# Patient Record
Sex: Male | Born: 1960 | Race: White | Hispanic: No | Smoking: Former smoker
Health system: Southern US, Community
[De-identification: ages and names within clinical notes are randomized; demographics above are authoritative.]

## PROBLEM LIST (undated history)

## (undated) DIAGNOSIS — R519 Headache, unspecified: Secondary | ICD-10-CM

## (undated) DIAGNOSIS — Z6838 Body mass index (BMI) 38.0-38.9, adult: Secondary | ICD-10-CM

## (undated) DIAGNOSIS — R51 Headache: Secondary | ICD-10-CM

## (undated) DIAGNOSIS — I10 Essential (primary) hypertension: Secondary | ICD-10-CM

## (undated) DIAGNOSIS — R079 Chest pain, unspecified: Secondary | ICD-10-CM

## (undated) DIAGNOSIS — J029 Acute pharyngitis, unspecified: Secondary | ICD-10-CM

## (undated) DIAGNOSIS — K219 Gastro-esophageal reflux disease without esophagitis: Secondary | ICD-10-CM

## (undated) DIAGNOSIS — R1310 Dysphagia, unspecified: Secondary | ICD-10-CM

## (undated) DIAGNOSIS — R000 Tachycardia, unspecified: Secondary | ICD-10-CM

## (undated) DIAGNOSIS — H9202 Otalgia, left ear: Secondary | ICD-10-CM

## (undated) HISTORY — DX: Tachycardia, unspecified: R00.0

## (undated) HISTORY — DX: Chest pain, unspecified: R07.9

## (undated) HISTORY — DX: Headache: R51

## (undated) HISTORY — DX: Dysphagia, unspecified: R13.10

## (undated) HISTORY — DX: Acute pharyngitis, unspecified: J02.9

## (undated) HISTORY — DX: Body mass index (BMI) 38.0-38.9, adult: Z68.38

## (undated) HISTORY — DX: Headache, unspecified: R51.9

## (undated) HISTORY — DX: Gastro-esophageal reflux disease without esophagitis: K21.9

## (undated) HISTORY — PX: BACK SURGERY: SHX140

## (undated) HISTORY — DX: Otalgia, left ear: H92.02

---

## 2003-09-01 DIAGNOSIS — M51379 Other intervertebral disc degeneration, lumbosacral region without mention of lumbar back pain or lower extremity pain: Secondary | ICD-10-CM | POA: Insufficient documentation

## 2003-09-01 DIAGNOSIS — M5137 Other intervertebral disc degeneration, lumbosacral region: Secondary | ICD-10-CM | POA: Insufficient documentation

## 2003-09-01 DIAGNOSIS — K589 Irritable bowel syndrome without diarrhea: Secondary | ICD-10-CM | POA: Insufficient documentation

## 2004-10-24 DIAGNOSIS — E785 Hyperlipidemia, unspecified: Secondary | ICD-10-CM | POA: Insufficient documentation

## 2006-12-20 DIAGNOSIS — M5030 Other cervical disc degeneration, unspecified cervical region: Secondary | ICD-10-CM | POA: Insufficient documentation

## 2008-05-25 DIAGNOSIS — M6530 Trigger finger, unspecified finger: Secondary | ICD-10-CM | POA: Insufficient documentation

## 2018-11-11 ENCOUNTER — Telehealth: Payer: Self-pay

## 2018-11-11 NOTE — Telephone Encounter (Signed)
NOTES ON FILE 

## 2018-12-09 ENCOUNTER — Ambulatory Visit (INDEPENDENT_AMBULATORY_CARE_PROVIDER_SITE_OTHER): Payer: Managed Care, Other (non HMO) | Admitting: Cardiology

## 2018-12-09 ENCOUNTER — Encounter: Payer: Self-pay | Admitting: Cardiology

## 2018-12-09 ENCOUNTER — Encounter (INDEPENDENT_AMBULATORY_CARE_PROVIDER_SITE_OTHER): Payer: Self-pay

## 2018-12-09 VITALS — BP 134/86 | HR 102 | Ht 66.0 in | Wt 253.0 lb

## 2018-12-09 DIAGNOSIS — R000 Tachycardia, unspecified: Secondary | ICD-10-CM

## 2018-12-09 DIAGNOSIS — R079 Chest pain, unspecified: Secondary | ICD-10-CM

## 2018-12-09 NOTE — Progress Notes (Signed)
Cardiology Office Note    Date:  12/10/2018   ID:  Ryan Buck, DOB November 22, 1960, MRN [id number]  PCP:  Lawerance Sabal, PA  Cardiologist:  Armanda Magic, MD   Chief Complaint  Patient presents with  . Chest Pain    History of Present Illness:  Ryan Buck is a 58 y.o. male who is being seen today for the evaluation of chest pain and abnormal EKG at the request of Lawerance Sabal, [place].  Is a 58 year old male with a history of obesity, GERD and sinus tachycardia.  He was seen on 11/06/2018 by his PCP with complaints of a sore throat.  It was felt that he had pharyngitis and was started on antibiotics.  He was also complaining of chest pain at the time and is now referred for further evaluation.  He tells me the chest pain has been going on for years and recently mentioned it to his PCP.  He describes it as midsternal with radiation into his neck and left arm associated with diaphoresis.  He says slow breathing and rest will resolve the discomfort.  He will intermittently get a dull left arm achiness as well but not necessarily occurs with his chest pain.  His chest pain is exertional and nonexertional.  He was started on a PPI but really is unsure if it has helped.  He also gets shortness of breath and fatigue with exertion or bending over.  He tells me the chest pain usually occurs at least once per week but occasionally 2-3 times per week and then sometimes will go a whole month without having pain.  It usually last 5 to 10 minutes and resolves with aspirin.  He denies any PND, orthopnea, lower extremity edema, dizziness or syncope.  His EKG in the office showed normal sinus rhythm to sinus tachycardia at 112 bpm with incomplete right bundle branch block.  He used to smoke but quit 1992.  He has a family history of CAD with his father having a CABG and several PCI's.  Past Medical History:  Diagnosis Date  . Body mass index (bmi) 38.0-38.9, adult   . Chest pain   . Dysphagia   . GERD  (gastroesophageal reflux disease)   . Headache   . Otalgia, left ear   . Pharyngitis   . Sinus tachycardia   . Sore throat       Current Medications: Current Meds  Medication Sig  . buPROPion (WELLBUTRIN XL) 150 MG 24 hr tablet Take 150 mg by mouth as directed.  Marland Kitchen buPROPion (WELLBUTRIN XL) 300 MG 24 hr tablet Take 300 mg by mouth as directed.  . celecoxib (CELEBREX) 100 MG capsule Take 100 mg by mouth 2 (two) times daily.  Marland Kitchen HYDROXYZINE HCL PO Take 50 mg by mouth 3 (three) times daily.  Marland Kitchen losartan-hydrochlorothiazide (HYZAAR) 100-12.5 MG tablet Take 1 tablet by mouth daily.  Marland Kitchen METOPROLOL TARTRATE PO Take 50 mg by mouth 2 (two) times daily.  Marland Kitchen omeprazole (PRILOSEC) 40 MG capsule Take 40 mg by mouth daily.  . traZODone (DESYREL) 50 MG tablet Take 50 mg by mouth at bedtime.    Allergies:   Patient has no known allergies.   Social History   Socioeconomic History  . Marital status: Legally Separated    Spouse name: Not on file  . Number of children: Not on file  . Years of education: Not on file  . Highest education level: Not on file  Occupational History  . Not on file  Social Needs  . Financial resource strain: Not on file  . Food insecurity:    Worry: Not on file    Inability: Not on file  . Transportation needs:    Medical: Not on file    Non-medical: Not on file  Tobacco Use  . Smoking status: Former Smoker    Last attempt to quit: 12/08/1990    Years since quitting: 28.0  . Smokeless tobacco: Never Used  Substance and Sexual Activity  . Alcohol use: Yes    Alcohol/week: 2.0 - 3.0 standard drinks    Types: 2 - 3 Cans of beer per week  . Drug use: Not on file  . Sexual activity: Not on file  Lifestyle  . Physical activity:    Days per week: Not on file    Minutes per session: Not on file  . Stress: Not on file  Relationships  . Social connections:    Talks on phone: Not on file    Gets together: Not on file    Attends religious service: Not on file     Active member of club or organization: Not on file    Attends meetings of clubs or organizations: Not on file    Relationship status: Not on file  Other Topics Concern  . Not on file  Social History Narrative  . Not on file     Family History:  The patient's family history includes CAD in his father; Heart disease in his father.   ROS:   Please see the history of present illness.    ROS All other systems reviewed and are negative.  No flowsheet data found.     PHYSICAL EXAM:   VS:  BP 134/86   Pulse (!) 102   Ht  (1.676 m)   Wt 253 lb (114.8 kg)   BMI 40.84 kg/m    GEN: Well nourished, well developed, in no acute distress  HEENT: normal  Neck: no JVD, carotid bruits, or masses Cardiac: RRR; no murmurs, rubs, or gallops,no edema.  Intact distal pulses bilaterally.  Respiratory:  clear to auscultation bilaterally, normal work of breathing GI: soft, nontender, nondistended, + BS MS: no deformity or atrophy  Skin: warm and dry, no rash Neuro:  Alert and Oriented x 3, Strength and sensation are intact Psych: euthymic mood, full affect  Wt Readings from Last 3 Encounters:  12/09/18 253 lb (114.8 kg)      Studies/Labs Reviewed:   EKG:  EKG is ordered today and showed sinus tachycardia at 102bpm with IRBBB  Recent Labs: No results found for requested labs within last 8760 hours.   Lipid Panel No results found for: CHOL, TRIG, HDL, CHOLHDL, VLDL, LDLCALC, LDLDIRECT  Additional studies/ records that were reviewed today include:  Office notes from PCP    ASSESSMENT:    1. Chest pain, unspecified type   2. Tachycardia      PLAN:  In order of problems listed above:  1.  Chest pain -his chest pain has typical and atypical components to it.  It is exertional as well as nonexertional.  Is been going on for years and usually occurs 1-2 times a week but occasionally he will go a whole month without having any discomfort.  When he does get it though there is  radiation of the discomfort along with the associated diaphoresis and improves with rest.  He is also been short of breath with exertional fatigue as well.  EKG in PCP office showed normal  sinus rhythm to sinus tachycardia 112 bpm with no ST changes and incomplete right bundle branch block.  The sinus tachycardia was in the setting of acute pharyngitis and likely related to pain and URI.  EKG in the office today showed normal sinus rhythm to sinus tach at 102 bpm.  Cardiac risk factors include remote history of tobacco use hypertension and family history of CAD.  I have recommended a coronary CTA with morphology and FFR to assess for obstructive CAD.  2.  Sinus tachycardia - heart rate was elevated in the PCP office but that was in the setting of acute pharyngitis and URI.  Heart rate today is 102 bpm.  I am going to get a 24-hour Holter monitor to assess average heart rate.  Complain of his heart racing at times.   Medication Adjustments/Labs and Tests Ordered: Current medicines are reviewed at length with the patient today.  Concerns regarding medicines are outlined above.  Medication changes, Labs and Tests ordered today are listed in the Patient Instructions below.  Patient Instructions  Medication Instructions:  Your physician recommends that you continue on your current medications as directed. Please refer to the Current Medication list given to you today.  If you need a refill on your cardiac medications before your next appointment, please call your pharmacy.   Lab work: Future: BMET, before Cardiac CT  If you have labs (blood work) drawn today and your tests are completely normal, you will receive your results only by: Marland Kitchen MyChart Message (if you have MyChart) OR . A paper copy in the mail If you have any lab test that is abnormal or we need to change your treatment, we will call you to review the results.  Testing/Procedures: Your physician has requested that you have an  echocardiogram. Echocardiography is a painless test that uses sound waves to create images of your heart. It provides your doctor with information about the size and shape of your heart and how well your heart's chambers and valves are working. This procedure takes approximately one hour. There are no restrictions for this procedure.  Your physician has recommended that you wear a 24 hour holter monitor. Holter monitors are medical devices that record the heart's electrical activity. Doctors most often use these monitors to diagnose arrhythmias. Arrhythmias are problems with the speed or rhythm of the heartbeat. The monitor is a small, portable device. You can wear one while you do your normal daily activities. This is usually used to diagnose what is causing palpitations/syncope (passing out).  Your physician has requested that you have cardiac CT. Cardiac computed tomography (CT) is a painless test that uses an x-ray machine to take clear, detailed pictures of your heart. For further information please visit [email or web address] Please follow instruction sheet as given.   Follow-Up: As needed, following results.   Please arrive at the [facility] main entrance of [facility] at xx:xx AM (30-45 minutes prior to test start time)  [facility] [address] [place], [place] [address] [phone]  Proceed to the [facility] Radiology Department (First Floor).  Please follow these instructions carefully (unless otherwise directed):  Hold all erectile dysfunction medications at least 48 hours prior to test.  On the Night Before the Test: . Be sure to Drink plenty of water. . Do not consume any caffeinated/decaffeinated beverages or chocolate 12 hours prior to your test. . Do not take any antihistamines 12 hours prior to your test.  On the Day of the  Test: . Drink plenty of water. Do not drink any water within one hour of the test. . Do not eat any food 4 hours prior to  the test. . You may take your regular medications prior to the test.  . Take metoprolol (Lopressor) 100 mg, two hours prior to test.  After the Test: . Drink plenty of water. . After receiving IV contrast, you may experience a mild flushed feeling. This is normal. . On occasion, you may experience a mild rash up to 24 hours after the test. This is not dangerous. If this occurs, you can take Benadryl 25 mg and increase your fluid intake. . If you experience trouble breathing, this can be serious. If it is severe call 911 IMMEDIATELY. If it is mild, please call our office. . If you take any of these medications: Glipizide/Metformin, Avandament, Glucavance, please do not take 48 hours after completing test.      Signed, Armanda Magic, MD  12/10/2018 10:33 AM    [facility] [address] [place], [place], [place]  [address] Phone: [phone]; Fax: [phone]

## 2018-12-09 NOTE — Patient Instructions (Addendum)
Medication Instructions:  Your physician recommends that you continue on your current medications as directed. Please refer to the Current Medication list given to you today.  If you need a refill on your cardiac medications before your next appointment, please call your pharmacy.   Lab work: Future: BMET, before Cardiac CT  If you have labs (blood work) drawn today and your tests are completely normal, you will receive your results only by: Marland Kitchen MyChart Message (if you have MyChart) OR . A paper copy in the mail If you have any lab test that is abnormal or we need to change your treatment, we will call you to review the results.  Testing/Procedures: Your physician has requested that you have an echocardiogram. Echocardiography is a painless test that uses sound waves to create images of your heart. It provides your doctor with information about the size and shape of your heart and how well your heart's chambers and valves are working. This procedure takes approximately one hour. There are no restrictions for this procedure.  Your physician has recommended that you wear a 24 hour holter monitor. Holter monitors are medical devices that record the heart's electrical activity. Doctors most often use these monitors to diagnose arrhythmias. Arrhythmias are problems with the speed or rhythm of the heartbeat. The monitor is a small, portable device. You can wear one while you do your normal daily activities. This is usually used to diagnose what is causing palpitations/syncope (passing out).  Your physician has requested that you have cardiac CT. Cardiac computed tomography (CT) is a painless test that uses an x-ray machine to take clear, detailed pictures of your heart. For further information please visit [email or web address] Please follow instruction sheet as given.   Follow-Up: As needed, following results.   Please arrive at the [facility] main entrance of [facility] at xx:xx AM (30-45  minutes prior to test start time)  [facility] [address] [place], [place] [address] [phone]  Proceed to the [facility] Radiology Department (First Floor).  Please follow these instructions carefully (unless otherwise directed):  Hold all erectile dysfunction medications at least 48 hours prior to test.  On the Night Before the Test: . Be sure to Drink plenty of water. . Do not consume any caffeinated/decaffeinated beverages or chocolate 12 hours prior to your test. . Do not take any antihistamines 12 hours prior to your test.  On the Day of the Test: . Drink plenty of water. Do not drink any water within one hour of the test. . Do not eat any food 4 hours prior to the test. . You may take your regular medications prior to the test.  . Take metoprolol (Lopressor) 100 mg, two hours prior to test.  After the Test: . Drink plenty of water. . After receiving IV contrast, you may experience a mild flushed feeling. This is normal. . On occasion, you may experience a mild rash up to 24 hours after the test. This is not dangerous. If this occurs, you can take Benadryl 25 mg and increase your fluid intake. . If you experience trouble breathing, this can be serious. If it is severe call 911 IMMEDIATELY. If it is mild, please call our office. . If you take any of these medications: Glipizide/Metformin, Avandament, Glucavance, please do not take 48 hours after completing test.

## 2018-12-10 ENCOUNTER — Encounter: Payer: Self-pay | Admitting: Cardiology

## 2018-12-11 ENCOUNTER — Ambulatory Visit (INDEPENDENT_AMBULATORY_CARE_PROVIDER_SITE_OTHER): Payer: Managed Care, Other (non HMO) | Admitting: Otolaryngology

## 2018-12-15 NOTE — Addendum Note (Signed)
Addended by: Madalyn Rob A on: 12/15/2018 07:39 AM   Modules accepted: Orders

## 2018-12-30 ENCOUNTER — Ambulatory Visit (HOSPITAL_COMMUNITY): Payer: Managed Care, Other (non HMO)

## 2018-12-31 ENCOUNTER — Telehealth: Payer: Self-pay | Admitting: Cardiology

## 2018-12-31 NOTE — Telephone Encounter (Signed)
Patient had been called  regarding mailing monitor.  Ryan Buck called on 3/30 and spoke with Scheduler - he stated:" cancel his procedures and didn't want to speak to anyone".  I will cancel his orders for ct and 24 hr monitor.

## 2019-10-05 ENCOUNTER — Inpatient Hospital Stay (HOSPITAL_COMMUNITY)
Admission: EM | Admit: 2019-10-05 | Discharge: 2019-10-09 | DRG: 177 | Disposition: A | Discharge status: Home / Self Care | Payer: Managed Care, Other (non HMO) | Attending: Internal Medicine | Admitting: Internal Medicine

## 2019-10-05 ENCOUNTER — Emergency Department (HOSPITAL_COMMUNITY): Payer: Managed Care, Other (non HMO)

## 2019-10-05 ENCOUNTER — Other Ambulatory Visit: Payer: Self-pay

## 2019-10-05 ENCOUNTER — Encounter (HOSPITAL_COMMUNITY): Payer: Self-pay

## 2019-10-05 DIAGNOSIS — E860 Dehydration: Secondary | ICD-10-CM | POA: Diagnosis present

## 2019-10-05 DIAGNOSIS — Z87891 Personal history of nicotine dependence: Secondary | ICD-10-CM

## 2019-10-05 DIAGNOSIS — I4519 Other right bundle-branch block: Secondary | ICD-10-CM | POA: Diagnosis present

## 2019-10-05 DIAGNOSIS — K219 Gastro-esophageal reflux disease without esophagitis: Secondary | ICD-10-CM | POA: Diagnosis present

## 2019-10-05 DIAGNOSIS — I10 Essential (primary) hypertension: Secondary | ICD-10-CM | POA: Diagnosis present

## 2019-10-05 DIAGNOSIS — R0902 Hypoxemia: Secondary | ICD-10-CM | POA: Diagnosis present

## 2019-10-05 DIAGNOSIS — U071 COVID-19: Principal | ICD-10-CM

## 2019-10-05 DIAGNOSIS — R0602 Shortness of breath: Secondary | ICD-10-CM | POA: Diagnosis present

## 2019-10-05 DIAGNOSIS — Z8249 Family history of ischemic heart disease and other diseases of the circulatory system: Secondary | ICD-10-CM | POA: Diagnosis not present

## 2019-10-05 DIAGNOSIS — Z6832 Body mass index (BMI) 32.0-32.9, adult: Secondary | ICD-10-CM

## 2019-10-05 DIAGNOSIS — J1282 Pneumonia due to coronavirus disease 2019: Secondary | ICD-10-CM | POA: Diagnosis present

## 2019-10-05 DIAGNOSIS — Z79899 Other long term (current) drug therapy: Secondary | ICD-10-CM

## 2019-10-05 HISTORY — DX: Essential (primary) hypertension: I10

## 2019-10-05 LAB — COMPREHENSIVE METABOLIC PANEL
ALT: 60 U/L — ABNORMAL HIGH (ref 0–44)
AST: 66 U/L — ABNORMAL HIGH (ref 15–41)
Albumin: 3.5 g/dL (ref 3.5–5.0)
Alkaline Phosphatase: 139 U/L — ABNORMAL HIGH (ref 38–126)
Anion gap: 13 (ref 5–15)
BUN: 9 mg/dL (ref 6–20)
CO2: 27 mmol/L (ref 22–32)
Calcium: 9 mg/dL (ref 8.9–10.3)
Chloride: 99 mmol/L (ref 98–111)
Creatinine, Ser: 0.95 mg/dL (ref 0.61–1.24)
GFR calc Af Amer: >60 mL/min (ref >60)
GFR calc non Af Amer: >60 mL/min (ref >60)
Glucose, Bld: 105 mg/dL — ABNORMAL HIGH (ref 70–99)
Potassium: 4.5 mmol/L (ref 3.5–5.1)
Sodium: 139 mmol/L (ref 135–145)
Total Bilirubin: 0.7 mg/dL (ref 0.3–1.2)
Total Protein: 7.4 g/dL (ref 6.5–8.1)

## 2019-10-05 LAB — CBC WITH DIFFERENTIAL/PLATELET
Abs Immature Granulocytes: 0 10*3/uL (ref 0.00–0.07)
Basophils Absolute: 0.1 10*3/uL (ref 0.0–0.1)
Basophils Relative: 1 %
Eosinophils Absolute: 0 10*3/uL (ref 0.0–0.5)
Eosinophils Relative: 0 %
HCT: 49.9 % (ref 39.0–52.0)
Hemoglobin: 16.4 g/dL (ref 13.0–17.0)
Lymphocytes Relative: 18 %
Lymphs Abs: 1.3 10*3/uL (ref 0.7–4.0)
MCH: 31.2 pg (ref 26.0–34.0)
MCHC: 32.9 g/dL (ref 30.0–36.0)
MCV: 94.9 fL (ref 80.0–100.0)
Monocytes Absolute: 0.4 10*3/uL (ref 0.1–1.0)
Monocytes Relative: 5 %
Neutro Abs: 5.3 10*3/uL (ref 1.7–7.7)
Neutrophils Relative %: 76 %
Platelets: 218 10*3/uL (ref 150–400)
RBC: 5.26 MIL/uL (ref 4.22–5.81)
RDW: 12.2 % (ref 11.5–15.5)
WBC: 7 10*3/uL (ref 4.0–10.5)
nRBC: 0 % (ref 0.0–0.2)
nRBC: 0 /100 WBC

## 2019-10-05 LAB — PROCALCITONIN: Procalcitonin: <0.1 ng/mL

## 2019-10-05 LAB — D-DIMER, QUANTITATIVE: D-Dimer, Quant: 2.15 ug/mL-FEU — ABNORMAL HIGH (ref 0.00–0.50)

## 2019-10-05 LAB — TRIGLYCERIDES: Triglycerides: 156 mg/dL — ABNORMAL HIGH (ref <150)

## 2019-10-05 LAB — LACTATE DEHYDROGENASE: LDH: 192 U/L (ref 98–192)

## 2019-10-05 LAB — LACTIC ACID, PLASMA: Lactic Acid, Venous: 1.5 mmol/L (ref 0.5–1.9)

## 2019-10-05 LAB — FERRITIN: Ferritin: 561 ng/mL — ABNORMAL HIGH (ref 24–336)

## 2019-10-05 LAB — C-REACTIVE PROTEIN: CRP: 6 mg/dL — ABNORMAL HIGH (ref <1.0)

## 2019-10-05 LAB — FIBRINOGEN: Fibrinogen: 793 mg/dL — ABNORMAL HIGH (ref 210–475)

## 2019-10-05 MED ORDER — ACETAMINOPHEN 325 MG PO TABS: 650.0000 mg | ORAL_TABLET | Freq: Four times a day (QID) | ORAL | Status: DC | PRN

## 2019-10-05 MED ORDER — HYDROCOD POLST-CPM POLST ER 10-8 MG/5ML PO SUER: 5.0000 mL | Freq: Two times a day (BID) | ORAL | Status: DC | PRN

## 2019-10-05 MED ORDER — SODIUM CHLORIDE 0.9 % IV SOLN: 100.0000 mg | Freq: Every day | INTRAVENOUS | Status: DC

## 2019-10-05 MED ORDER — SODIUM CHLORIDE 0.9 % IV SOLN
100.0000 mg | Freq: Every day | INTRAVENOUS | Status: AC
  Administered 2019-10-06 – 2019-10-09 (×4): 100 mg via INTRAVENOUS
  Filled 2019-10-05 (×4): qty 20

## 2019-10-05 MED ORDER — ALBUTEROL SULFATE HFA 108 (90 BASE) MCG/ACT IN AERS
2.0000 | INHALATION_SPRAY | Freq: Four times a day (QID) | RESPIRATORY_TRACT | Status: DC
  Administered 2019-10-06 – 2019-10-09 (×12): 2 via RESPIRATORY_TRACT
  Filled 2019-10-05 (×2): qty 6.7

## 2019-10-05 MED ORDER — SODIUM CHLORIDE 0.9 % IV SOLN
200.0000 mg | Freq: Once | INTRAVENOUS | Status: AC
  Administered 2019-10-05: 200 mg via INTRAVENOUS
  Filled 2019-10-05: qty 200

## 2019-10-05 MED ORDER — SODIUM CHLORIDE 0.9 % IV SOLN: INTRAVENOUS | Status: DC

## 2019-10-05 MED ORDER — ONDANSETRON HCL 4 MG PO TABS: 4.0000 mg | ORAL_TABLET | Freq: Four times a day (QID) | ORAL | Status: DC | PRN

## 2019-10-05 MED ORDER — DEXAMETHASONE SODIUM PHOSPHATE 10 MG/ML IJ SOLN
6.0000 mg | INTRAMUSCULAR | Status: DC
  Administered 2019-10-05 – 2019-10-08 (×4): 6 mg via INTRAVENOUS
  Filled 2019-10-05 (×4): qty 1

## 2019-10-05 MED ORDER — SODIUM CHLORIDE 0.9 % IV SOLN: 200.0000 mg | Freq: Once | INTRAVENOUS | Status: DC

## 2019-10-05 MED ORDER — ENOXAPARIN SODIUM 40 MG/0.4ML ~~LOC~~ SOLN
40.0000 mg | SUBCUTANEOUS | Status: DC
  Administered 2019-10-05: 40 mg via SUBCUTANEOUS
  Filled 2019-10-05 (×3): qty 0.4

## 2019-10-05 MED ORDER — SODIUM CHLORIDE 0.9 % IV BOLUS
1000.0000 mL | Freq: Once | INTRAVENOUS | Status: AC
  Administered 2019-10-05: 19:00:00 1000 mL via INTRAVENOUS

## 2019-10-05 MED ORDER — SODIUM CHLORIDE 0.9 % IV SOLN
500.0000 mg | INTRAVENOUS | Status: DC
  Administered 2019-10-06: 500 mg via INTRAVENOUS
  Filled 2019-10-05: qty 500

## 2019-10-05 MED ORDER — GUAIFENESIN-DM 100-10 MG/5ML PO SYRP
10.0000 mL | ORAL_SOLUTION | ORAL | Status: DC | PRN
  Administered 2019-10-06 – 2019-10-07 (×2): 10 mL via ORAL
  Filled 2019-10-05 (×2): qty 10

## 2019-10-05 MED ORDER — SODIUM CHLORIDE 0.9 % IV SOLN
1.0000 g | INTRAVENOUS | Status: DC
  Administered 2019-10-05: 1 g via INTRAVENOUS
  Filled 2019-10-05: qty 10

## 2019-10-05 MED ORDER — ONDANSETRON HCL 4 MG/2ML IJ SOLN
4.0000 mg | Freq: Four times a day (QID) | INTRAMUSCULAR | Status: DC | PRN
  Administered 2019-10-07: 4 mg via INTRAVENOUS
  Filled 2019-10-05: qty 2

## 2019-10-05 MED ORDER — FAMOTIDINE 20 MG PO TABS
20.0000 mg | ORAL_TABLET | Freq: Two times a day (BID) | ORAL | Status: DC
  Administered 2019-10-06 – 2019-10-09 (×8): 20 mg via ORAL
  Filled 2019-10-05 (×7): qty 1

## 2019-10-05 NOTE — ED Triage Notes (Signed)
Pt presents to the ED after testing positive for COVID-19 on 12/23. Pt reports he has had worsening cough and generalized body aches at home. Pt reports he has been taking mucinex and tylenol at home to control his symptoms. Pt denying any pain at present time, complains of chest soreness with a cough and generalized body aches.

## 2019-10-05 NOTE — H&P (Signed)
History and Physical   Ryan Buck HYW:[id number] DOB: 12/28/1960 DOA: 10/05/2019  Referring MD/NP/PA: Dr. Billy Fischer  PCP: Denny Levy, [place]   Outpatient Specialists: None  Patient coming from: Home  Chief Complaint: Shortness of breath and cough with generalized weakness  HPI: Denman Pichardo is a 59 y.o. male with medical history significant of GERD, hypertension, morbid obesity, who presented to the ER after tested positive for COVID-19 on December 23.  He was trying to treat his symptoms at home symptomatically.  He has been taking Mucinex and Tylenol.  Also practicing quarantine.  Symptoms have continued to get worse.  Patient came in today with generalized body aches cough and shortness of breath.  He was seen and evaluated.  Patient has persistent sinus tachycardia at this point with his symptoms.  He is afebrile and does not appear to have significant bilateral infiltrates.  Patient is being admitted with COVID-19 pneumonia being symptomatic.Marland Kitchen  ED Course: Temperature 98.1 blood pressure 140/100 pulse 118 respiratory rate of 33 oxygen sat 94% on room air.  Chemistry appears to be within normal except for alkaline phosphatase 129, CBC entirely within normal.  LDH is 192 triglyceride 156 ferritin 561 CRP 6.0 lactic acid 1.5 procalcitonin less than 0.1.  Patient being admitted for treatment of Covid pneumonia showing low volume and left lung basal infiltrate..  Review of Systems: As per HPI otherwise 10 point review of systems negative.    Past Medical History:  Diagnosis Date   Body mass index (bmi) 38.0-38.9, adult    Chest pain    Dysphagia    GERD (gastroesophageal reflux disease)    Headache    Hypertension    Otalgia, left ear    Pharyngitis    Sinus tachycardia    Sore throat     Past Surgical History:  Procedure Laterality Date   BACK SURGERY       reports that he quit smoking about 28 years ago. He has never used smokeless tobacco. He reports current alcohol  use of about 2.0 - 3.0 standard drinks of alcohol per week. No history on file for drug.  No Known Allergies  Family History  Problem Relation Age of Onset   Heart disease Father    CAD Father      Prior to Admission medications   Medication Sig Start Date End Date Taking? Authorizing Provider  buPROPion (WELLBUTRIN XL) 150 MG 24 hr tablet Take 150 mg by mouth as directed. 10/09/18   [provider]  buPROPion (WELLBUTRIN XL) 300 MG 24 hr tablet Take 300 mg by mouth as directed. 10/09/18   [provider]  celecoxib (CELEBREX) 100 MG capsule Take 100 mg by mouth 2 (two) times daily. 05/31/17   [provider]  HYDROXYZINE HCL PO Take 50 mg by mouth 3 (three) times daily.    [provider]  losartan-hydrochlorothiazide (HYZAAR) 100-12.5 MG tablet Take 1 tablet by mouth daily.    [provider]  METOPROLOL TARTRATE PO Take 50 mg by mouth 2 (two) times daily.    [provider]  omeprazole (PRILOSEC) 40 MG capsule Take 40 mg by mouth daily.    [provider]  traZODone (DESYREL) 50 MG tablet Take 50 mg by mouth at bedtime.    [provider]    Physical Exam: Vitals:   10/05/19 1815 10/05/19 1845 10/05/19 1930 10/05/19 2209  BP: (!) 131/105 (!) 143/103 (!) 148/100   Pulse: (!) 110 (!) 109 (!) 107   Resp:  Temp:      TempSrc:      SpO2: 94% 94% 96% 95%  Weight:      Height:          Constitutional: Morbidly obese in no distress Vitals:   10/05/19 1815 10/05/19 1845 10/05/19 1930 10/05/19 2209  BP: (!) 131/105 (!) 143/103 (!) 148/100   Pulse: (!) 110 (!) 109 (!) 107   Resp:      Temp:      TempSrc:      SpO2: 94% 94% 96% 95%  Weight:      Height:       Eyes: PERRL, lids and conjunctivae normal ENMT: Mucous membranes are moist. Posterior pharynx clear of any exudate or lesions.Normal dentition.  Neck: normal, supple, no masses, no thyromegaly Respiratory: Decreased air entry with coarse breath  sounds and rhonchi, no wheeze normal respiratory effort. No accessory muscle use.  Cardiovascular: Tachycardia, no murmurs / rubs / gallops. No extremity edema. 2+ pedal pulses. No carotid bruits.  Abdomen: no tenderness, no masses palpated. No hepatosplenomegaly. Bowel sounds positive.  Musculoskeletal: no clubbing / cyanosis. No joint deformity upper and lower extremities. Good ROM, no contractures. Normal muscle tone.  Skin: no rashes, lesions, ulcers. No induration Neurologic: CN 2-12 grossly intact. Sensation intact, DTR normal. Strength 5/5 in all 4.  Psychiatric: Normal judgment and insight. Alert and oriented x 3. Normal mood.     Labs on Admission: I have personally reviewed following labs and imaging studies  CBC: Recent Labs  Lab 10/05/19 1844  WBC 7.0  NEUTROABS 5.3  HGB 16.4  HCT 49.9  MCV 94.9  PLT 218   Basic Metabolic Panel: Recent Labs  Lab 10/05/19 1844  NA 139  K 4.5  CL 99  CO2 27  GLUCOSE 105*  BUN 9  CREATININE 0.95  CALCIUM 9.0   GFR: Estimated Creatinine Clearance: 105.9 mL/min (by C-G formula based on SCr of 0.95 mg/dL). Liver Function Tests: Recent Labs  Lab 10/05/19 1844  AST 66*  ALT 60*  ALKPHOS 139*  BILITOT 0.7  PROT 7.4  ALBUMIN 3.5   No results for input(s): LIPASE, AMYLASE in the last 168 hours. No results for input(s): AMMONIA in the last 168 hours. Coagulation Profile: No results for input(s): INR, PROTIME in the last 168 hours. Cardiac Enzymes: No results for input(s): CKTOTAL, CKMB, CKMBINDEX, TROPONINI in the last 168 hours. BNP (last 3 results) No results for input(s): PROBNP in the last 8760 hours. HbA1C: No results for input(s): HGBA1C in the last 72 hours. CBG: No results for input(s): GLUCAP in the last 168 hours. Lipid Profile: Recent Labs    10/05/19 2126  TRIG 156*   Thyroid Function Tests: No results for input(s): TSH, T4TOTAL, FREET4, T3FREE, THYROIDAB in the last 72 hours. Anemia Panel: Recent Labs     10/05/19 2125  FERRITIN 561*   Urine analysis: No results found for: COLORURINE, APPEARANCEUR, LABSPEC, PHURINE, GLUCOSEU, HGBUR, BILIRUBINUR, KETONESUR, PROTEINUR, UROBILINOGEN, NITRITE, LEUKOCYTESUR Sepsis Labs: @LABRCNTIP (procalcitonin:4,lacticidven:4) )No results found for this or any previous visit (from the past 240 hour(s)).   Radiological Exams on Admission: DG Chest Portable 1 View  Result Date: 10/05/2019 CLINICAL DATA:  Cough generalized body aches recent positive COVID-19 test. EXAM: PORTABLE CHEST 1 VIEW COMPARISON:  None FINDINGS: Cardiomediastinal contours accentuated by low depth of expansion. Similar changes associated with prominence of hila on left and right due to low volume chest. Partial silhouetting of the left hemidiaphragm. Subtle increased interstitial markings/vascular crowding. No  signs of dense consolidation or pleural effusion. Visualized skeletal structures are unremarkable. IMPRESSION: Low volume chest but with area in the left lung base suspicious for pneumonia Electronically Signed   By: Donzetta Kohut M.D.   On: 10/05/2019 18:05    EKG: Independently reviewed.  Shows sinus tachycardia with no significant ST changes  Assessment/Plan Principal Problem:   Pneumonia due to COVID-19 virus Active Problems:   GERD (gastroesophageal reflux disease)   Benign essential HTN   Morbid obesity (HCC)     #1 COVID-19 pneumonia: Patient has new oxygen demand.  He also has significant sinus tachycardia probably due to dehydration.  We will admit the patient and initiate him on COVID-19 pneumonia protocol.  Placed him on oxygen with remdesivir, dexamethasone, azithromycin with Rocephin as well as albuterol inhalers.  Also monitor his markers.  He may be a candidate for Actemra if CRP continues to rise.  #2 hypertension: Confirm and resume home medications.  #3 morbid obesity: Dietary counseling.  #4 GERD: Continue with PPIs   DVT prophylaxis: Lovenox Code  Status: Full code Family Communication: No family at bedside Disposition Plan: Home Consults called: None Admission status: Inpatient  Severity of Illness: The appropriate patient status for this patient is INPATIENT. Inpatient status is judged to be reasonable and necessary in order to provide the required intensity of service to ensure the patient's safety. The patient's presenting symptoms, physical exam findings, and initial radiographic and laboratory data in the context of their chronic comorbidities is felt to place them at high risk for further clinical deterioration. Furthermore, it is not anticipated that the patient will be medically stable for discharge from the hospital within 2 midnights of admission. The following factors support the patient status of inpatient.   " The patient's presenting symptoms include shortness of breath and cough. " The worrisome physical exam findings include coarse breath sounds. " The initial radiographic and laboratory data are worrisome because of evidence of left lobar pneumonia. " The chronic co-morbidities include hypertension and GERD.   * I certify that at the point of admission it is my clinical judgment that the patient will require inpatient hospital care spanning beyond 2 midnights from the point of admission due to high intensity of service, high risk for further deterioration and high frequency of surveillance required.Lonia Blood MD [facility] Pager [phone]  If 7PM-7AM, please contact night-coverage [email or web address] Password [email or web address]  10/05/2019, 11:50 PM

## 2019-10-06 LAB — CREATININE, SERUM
Creatinine, Ser: 0.97 mg/dL (ref 0.61–1.24)
GFR calc Af Amer: >60 mL/min (ref >60)
GFR calc non Af Amer: >60 mL/min (ref >60)

## 2019-10-06 LAB — COMPREHENSIVE METABOLIC PANEL
ALT: 52 U/L — ABNORMAL HIGH (ref 0–44)
AST: 53 U/L — ABNORMAL HIGH (ref 15–41)
Albumin: 3 g/dL — ABNORMAL LOW (ref 3.5–5.0)
Alkaline Phosphatase: 123 U/L (ref 38–126)
Anion gap: 12 (ref 5–15)
BUN: 8 mg/dL (ref 6–20)
CO2: 23 mmol/L (ref 22–32)
Calcium: 8.5 mg/dL — ABNORMAL LOW (ref 8.9–10.3)
Chloride: 104 mmol/L (ref 98–111)
Creatinine, Ser: 0.99 mg/dL (ref 0.61–1.24)
GFR calc Af Amer: >60 mL/min (ref >60)
GFR calc non Af Amer: >60 mL/min (ref >60)
Glucose, Bld: 154 mg/dL — ABNORMAL HIGH (ref 70–99)
Potassium: 4.4 mmol/L (ref 3.5–5.1)
Sodium: 139 mmol/L (ref 135–145)
Total Bilirubin: 0.5 mg/dL (ref 0.3–1.2)
Total Protein: 6.7 g/dL (ref 6.5–8.1)

## 2019-10-06 LAB — CBC WITH DIFFERENTIAL/PLATELET
Abs Immature Granulocytes: 0.07 10*3/uL (ref 0.00–0.07)
Basophils Absolute: 0 10*3/uL (ref 0.0–0.1)
Basophils Relative: 1 %
Eosinophils Absolute: 0 10*3/uL (ref 0.0–0.5)
Eosinophils Relative: 0 %
HCT: 45.9 % (ref 39.0–52.0)
Hemoglobin: 15.1 g/dL (ref 13.0–17.0)
Immature Granulocytes: 1 %
Lymphocytes Relative: 19 %
Lymphs Abs: 1.3 10*3/uL (ref 0.7–4.0)
MCH: 31.3 pg (ref 26.0–34.0)
MCHC: 32.9 g/dL (ref 30.0–36.0)
MCV: 95 fL (ref 80.0–100.0)
Monocytes Absolute: 0.2 10*3/uL (ref 0.1–1.0)
Monocytes Relative: 3 %
Neutro Abs: 4.9 10*3/uL (ref 1.7–7.7)
Neutrophils Relative %: 76 %
Platelets: 224 10*3/uL (ref 150–400)
RBC: 4.83 MIL/uL (ref 4.22–5.81)
RDW: 12.2 % (ref 11.5–15.5)
WBC: 6.5 10*3/uL (ref 4.0–10.5)
nRBC: 0 % (ref 0.0–0.2)

## 2019-10-06 LAB — CBC
HCT: 46.8 % (ref 39.0–52.0)
Hemoglobin: 15.2 g/dL (ref 13.0–17.0)
MCH: 31.3 pg (ref 26.0–34.0)
MCHC: 32.5 g/dL (ref 30.0–36.0)
MCV: 96.3 fL (ref 80.0–100.0)
Platelets: 210 10*3/uL (ref 150–400)
RBC: 4.86 MIL/uL (ref 4.22–5.81)
RDW: 12.3 % (ref 11.5–15.5)
WBC: 8.5 10*3/uL (ref 4.0–10.5)
nRBC: 0 % (ref 0.0–0.2)

## 2019-10-06 LAB — LACTIC ACID, PLASMA: Lactic Acid, Venous: 1.3 mmol/L (ref 0.5–1.9)

## 2019-10-06 LAB — MRSA PCR SCREENING: MRSA by PCR: NEGATIVE

## 2019-10-06 LAB — C-REACTIVE PROTEIN
CRP: 5.7 mg/dL — ABNORMAL HIGH (ref <1.0)
CRP: 5.2 mg/dL — ABNORMAL HIGH (ref <1.0)

## 2019-10-06 LAB — FERRITIN: Ferritin: 614 ng/mL — ABNORMAL HIGH (ref 24–336)

## 2019-10-06 LAB — D-DIMER, QUANTITATIVE: D-Dimer, Quant: 2.31 ug/mL-FEU — ABNORMAL HIGH (ref 0.00–0.50)

## 2019-10-06 LAB — ABO/RH: ABO/RH(D): A POS

## 2019-10-06 LAB — HIV ANTIBODY (ROUTINE TESTING W REFLEX): HIV Screen 4th Generation wRfx: NONREACTIVE

## 2019-10-06 MED ORDER — SODIUM CHLORIDE 0.9 % IV SOLN: INTRAVENOUS | Status: AC

## 2019-10-06 MED ORDER — BUSPIRONE HCL 15 MG PO TABS
7.5000 mg | ORAL_TABLET | Freq: Two times a day (BID) | ORAL | Status: DC
  Administered 2019-10-06 – 2019-10-09 (×6): 7.5 mg via ORAL
  Filled 2019-10-06 (×7): qty 1

## 2019-10-06 MED ORDER — METOPROLOL TARTRATE 50 MG PO TABS
50.0000 mg | ORAL_TABLET | Freq: Two times a day (BID) | ORAL | Status: DC
  Administered 2019-10-06 – 2019-10-09 (×6): 50 mg via ORAL
  Filled 2019-10-06 (×6): qty 1

## 2019-10-06 NOTE — ED Notes (Signed)
ED TO INPATIENT HANDOFF REPORT  ED Nurse Name and Phone #: Percival Spanish [phone]  S Name/Age/Gender Ryan Buck 59 y.o. male Room/Bed: 007C/007C  Code Status   Code Status: Full Code  Home/SNF/Other Home Patient oriented to: self, place, time and situation Is this baseline? Yes   Triage Complete: Triage complete  Chief Complaint Pneumonia due to COVID-19 virus [U07.1, J12.82]  Triage Note Pt presents to the ED after testing positive for COVID-19 on 12/23. Pt reports he has had worsening cough and generalized body aches at home. Pt reports he has been taking mucinex and tylenol at home to control his symptoms. Pt denying any pain at present time, complains of chest soreness with a cough and generalized body aches.     Allergies No Known Allergies  Level of Care/Admitting Diagnosis ED Disposition    ED Disposition Condition Comment   Admit  Hospital Area: [facility] [[id number]]  Level of Care: Telemetry Medical [[id number]]  Covid Evaluation: Confirmed COVID Positive  Diagnosis: Pneumonia due to COVID-19 virus [[id number]]  Admitting Physician: Rometta Emery [[id number]]  Attending Physician: Rometta Emery [[id number]]  Estimated length of stay: past midnight tomorrow  Certification:: I certify this patient will need inpatient services for at least 2 midnights       B Medical/Surgery History Past Medical History:  Diagnosis Date  . Body mass index (bmi) 38.0-38.9, adult   . Chest pain   . Dysphagia   . GERD (gastroesophageal reflux disease)   . Headache   . Hypertension   . Otalgia, left ear   . Pharyngitis   . Sinus tachycardia   . Sore throat    Past Surgical History:  Procedure Laterality Date  . BACK SURGERY       A IV Location/Drains/Wounds Patient Lines/Drains/Airways Status   Active Line/Drains/Airways    Name:   Placement date:   Placement time:   Site:   Days:   Peripheral IV 10/05/19 Right Antecubital   10/05/19    1842    Antecubital   1    Peripheral IV 10/05/19 Left Antecubital   10/05/19    2321    Antecubital   1          Intake/Output Last 24 hours  Intake/Output Summary (Last 24 hours) at 10/06/2019 1523 Last data filed at 10/06/2019 1142 Gross per 24 hour  Intake 1850 ml  Output --  Net 1850 ml    Labs/Imaging Results for orders placed or performed during the hospital encounter of 10/05/19 (from the past 48 hour(s))  CBC with Differential     Status: None   Collection Time: 10/05/19  6:44 PM  Result Value Ref Range   WBC 7.0 4.0 - 10.5 K/uL   RBC 5.26 4.22 - 5.81 MIL/uL   Hemoglobin 16.4 13.0 - 17.0 g/dL   HCT 64.3 32.9 - 51.8 %   MCV 94.9 80.0 - 100.0 fL   MCH 31.2 26.0 - 34.0 pg   MCHC 32.9 30.0 - 36.0 g/dL   RDW 84.1 66.0 - 63.0 %   Platelets 218 150 - 400 K/uL   nRBC 0.0 0.0 - 0.2 %   Neutrophils Relative % 76 %   Neutro Abs 5.3 1.7 - 7.7 K/uL   Lymphocytes Relative 18 %   Lymphs Abs 1.3 0.7 - 4.0 K/uL   Monocytes Relative 5 %   Monocytes Absolute 0.4 0.1 - 1.0 K/uL   Eosinophils Relative 0 %   Eosinophils Absolute 0.0 0.0 -  0.5 K/uL   Basophils Relative 1 %   Basophils Absolute 0.1 0.0 - 0.1 K/uL   nRBC 0 0 /100 WBC   Abs Immature Granulocytes 0.00 0.00 - 0.07 K/uL    Comment: Performed at [facility], [address] [place], [place] [address]  Comprehensive metabolic panel     Status: Abnormal   Collection Time: 10/05/19  6:44 PM  Result Value Ref Range   Sodium 139 135 - 145 mmol/L   Potassium 4.5 3.5 - 5.1 mmol/L   Chloride 99 98 - 111 mmol/L   CO2 27 22 - 32 mmol/L   Glucose, Bld 105 (H) 70 - 99 mg/dL   BUN 9 6 - 20 mg/dL   Creatinine, Ser 9.140.95 0.61 - 1.24 mg/dL   Calcium 9.0 8.9 - 78.210.3 mg/dL   Total Protein 7.4 6.5 - 8.1 g/dL   Albumin 3.5 3.5 - 5.0 g/dL   AST 66 (H) 15 - 41 U/L   ALT 60 (H) 0 - 44 U/L   Alkaline Phosphatase 139 (H) 38 - 126 U/L   Total Bilirubin 0.7 0.3 - 1.2 mg/dL   GFR calc non Af Amer >60 >60 mL/min   GFR calc Af Amer >60 >60 mL/min   Anion gap 13 5  - 15    Comment: Performed at [facility], [address] [place], [place] [address]  Lactic acid, plasma     Status: None   Collection Time: 10/05/19  9:25 PM  Result Value Ref Range   Lactic Acid, Venous 1.5 0.5 - 1.9 mmol/L    Comment: Performed at [facility], [address] [place], [place] [address]  Blood Culture (routine x 2)     Status: None (Preliminary result)   Collection Time: 10/05/19  9:25 PM   Specimen: BLOOD  Result Value Ref Range   Specimen Description BLOOD LEFT ANTECUBITAL    Special Requests      BOTTLES DRAWN AEROBIC AND ANAEROBIC Blood Culture results may not be optimal due to an inadequate volume of blood received in culture bottles   Culture      NO GROWTH < 24 HOURS Performed at [facility], [address] [place], [place] [address]    Report Status PENDING   D-dimer, quantitative     Status: Abnormal   Collection Time: 10/05/19  9:25 PM  Result Value Ref Range   D-Dimer, Quant 2.15 (H) 0.00 - 0.50 ug/mL-FEU    Comment: (NOTE) At the manufacturer cut-off of 0.50 ug/mL FEU, this assay has been documented to exclude PE with a sensitivity and negative predictive value of 97 to 99%.  At this time, this assay has not been approved by the FDA to exclude DVT/VTE. Results should be correlated with clinical presentation. Performed at [facility], [address] [place], [place] [address]   Lactate dehydrogenase     Status: None   Collection Time: 10/05/19  9:25 PM  Result Value Ref Range   LDH 192 98 - 192 U/L    Comment: Performed at [facility], [address] [place], [place] [address]  Ferritin     Status: Abnormal   Collection Time: 10/05/19  9:25 PM  Result Value Ref Range   Ferritin 561 (H) 24 - 336 ng/mL    Comment: Performed at [facility], [address] [place], [place] [address]  Fibrinogen     Status: Abnormal   Collection  Time: 10/05/19  9:25 PM  Result Value Ref Range   Fibrinogen 793 (H) 210  - 475 mg/dL    Comment: Performed at [place] [address], [place], [place] [address]  C-reactive protein     Status: Abnormal   Collection Time: 10/05/19  9:25 PM  Result Value Ref Range   CRP 6.0 (H) <1.0 mg/dL    Comment: Performed at [facility], [place] [address] [place], [place] [address]  Triglycerides     Status: Abnormal   Collection Time: 10/05/19  9:26 PM  Result Value Ref Range   Triglycerides 156 (H) <150 mg/dL    Comment: Performed at [place] [address], [place],  [address]  Procalcitonin     Status: None   Collection Time: 10/05/19  9:26 PM  Result Value Ref Range   Procalcitonin <0.10 ng/mL    Comment:        Interpretation: PCT (Procalcitonin) <= 0.5 ng/mL: Systemic infection (sepsis) is not likely. Local bacterial infection is possible. (NOTE)       Sepsis PCT Algorithm           Lower Respiratory Tract                                      Infection PCT Algorithm    ----------------------------     ----------------------------         PCT < 0.25 ng/mL                PCT < 0.10 ng/mL         Strongly encourage             Strongly discourage   discontinuation of antibiotics    initiation of antibiotics    ----------------------------     -----------------------------       PCT 0.25 - 0.50 ng/mL            PCT 0.10 - 0.25 ng/mL               OR       >80% decrease in PCT            Discourage initiation of                                            antibiotics      Encourage discontinuation           of antibiotics    ----------------------------     -----------------------------         PCT >= 0.50 ng/mL              PCT 0.26 - 0.50 ng/mL               AND        <80% decrease in PCT             Encourage initiation of                                             antibiotics       Encourage continuation  of antibiotics    ----------------------------     -----------------------------        PCT >= 0.50 ng/mL                   PCT > 0.50 ng/mL               AND         increase in PCT                  Strongly encourage                                      initiation of antibiotics    Strongly encourage escalation           of antibiotics                                     -----------------------------                                           PCT <= 0.25 ng/mL                                                 OR                                        > 80% decrease in PCT                                     Discontinue / Do not initiate                                             antibiotics Performed at [facility], [address] [place], [place] [address]   Blood Culture (routine x 2)     Status: None (Preliminary result)   Collection Time: 10/05/19 10:38 PM   Specimen: BLOOD  Result Value Ref Range   Specimen Description BLOOD SITE NOT SPECIFIED    Special Requests      BOTTLES DRAWN AEROBIC AND ANAEROBIC Blood Culture results may not be optimal due to an inadequate volume of blood received in culture bottles   Culture      NO GROWTH < 24 HOURS Performed at [facility], [address] [place], [place] [address]    Report Status PENDING   ABO/Rh     Status: None   Collection Time: 10/05/19 11:06 PM  Result Value Ref Range   ABO/RH(D)      A POS Performed at [facility], [address] [place], [place] [address]   Lactic acid, plasma     Status: None   Collection Time: 10/06/19  1:24 AM  Result Value Ref Range   Lactic Acid, Venous 1.3  0.5 - 1.9 mmol/L    Comment: Performed at [facility], [address] [place], [place] [address]  HIV Antibody (routine testing w rflx)     Status: None   Collection Time: 10/06/19  1:24 AM  Result Value Ref Range   HIV Screen 4th Generation wRfx NON REACTIVE NON REACTIVE    Comment: Performed at [facility], [address], [place], [place] [address]  CBC     Status: None   Collection Time: 10/06/19  1:24  AM  Result Value Ref Range   WBC 8.5 4.0 - 10.5 K/uL   RBC 4.86 4.22 - 5.81 MIL/uL   Hemoglobin 15.2 13.0 - 17.0 g/dL   HCT 09.8 11.9 - 14.7 %   MCV 96.3 80.0 - 100.0 fL   MCH 31.3 26.0 - 34.0 pg   MCHC 32.5 30.0 - 36.0 g/dL   RDW 82.9 56.2 - 13.0 %   Platelets 210 150 - 400 K/uL   nRBC 0.0 0.0 - 0.2 %    Comment: Performed at [facility], [address], [place], [place] [address]  Creatinine, serum     Status: None   Collection Time: 10/06/19  1:24 AM  Result Value Ref Range   Creatinine, Ser 0.97 0.61 - 1.24 mg/dL   GFR calc non Af Amer >60 >60 mL/min   GFR calc Af Amer >60 >60 mL/min    Comment: Performed at [facility], [address], [place], [place] [address]  C-reactive protein     Status: Abnormal   Collection Time: 10/06/19  1:24 AM  Result Value Ref Range   CRP 5.7 (H) <1.0 mg/dL    Comment: Performed at [facility], [address] [place], [place] [address]  CBC with Differential/Platelet     Status: None   Collection Time: 10/06/19  5:25 AM  Result Value Ref Range   WBC 6.5 4.0 - 10.5 K/uL   RBC 4.83 4.22 - 5.81 MIL/uL   Hemoglobin 15.1 13.0 - 17.0 g/dL   HCT 13.2 44.0 - 10.2 %   MCV 95.0 80.0 - 100.0 fL   MCH 31.3 26.0 - 34.0 pg   MCHC 32.9 30.0 - 36.0 g/dL   RDW 72.5 36.6 - 44.0 %   Platelets 224 150 - 400 K/uL   nRBC 0.0 0.0 - 0.2 %   Neutrophils Relative % 76 %   Neutro Abs 4.9 1.7 - 7.7 K/uL   Lymphocytes Relative 19 %   Lymphs Abs 1.3 0.7 - 4.0 K/uL   Monocytes Relative 3 %   Monocytes Absolute 0.2 0.1 - 1.0 K/uL   Eosinophils Relative 0 %   Eosinophils Absolute 0.0 0.0 - 0.5 K/uL   Basophils Relative 1 %   Basophils Absolute 0.0 0.0 - 0.1 K/uL   Immature Granulocytes 1 %   Abs Immature Granulocytes 0.07 0.00 - 0.07 K/uL   Reactive, Benign Lymphocytes PRESENT     Comment: Performed at [facility], [address] [place], [place] [address]  Comprehensive metabolic panel     Status: Abnormal   Collection Time:  10/06/19  5:25 AM  Result Value Ref Range   Sodium 139 135 - 145 mmol/L   Potassium 4.4 3.5 - 5.1 mmol/L   Chloride 104 98 - 111 mmol/L   CO2 23 22 - 32 mmol/L   Glucose, Bld 154 (H) 70 - 99 mg/dL   BUN 8 6 - 20 mg/dL  Creatinine, Ser 0.99 0.61 - 1.24 mg/dL   Calcium 8.5 (L) 8.9 - 10.3 mg/dL   Total Protein 6.7 6.5 - 8.1 g/dL   Albumin 3.0 (L) 3.5 - 5.0 g/dL   AST 53 (H) 15 - 41 U/L   ALT 52 (H) 0 - 44 U/L   Alkaline Phosphatase 123 38 - 126 U/L   Total Bilirubin 0.5 0.3 - 1.2 mg/dL   GFR calc non Af Amer >60 >60 mL/min   GFR calc Af Amer >60 >60 mL/min   Anion gap 12 5 - 15    Comment: Performed at [facility], [address], [place], [place] [address]  C-reactive protein     Status: Abnormal   Collection Time: 10/06/19  5:25 AM  Result Value Ref Range   CRP 5.2 (H) <1.0 mg/dL    Comment: Performed at [facility], [address], [place], [place] [address]  D-dimer, quantitative (not at [facility])     Status: Abnormal   Collection Time: 10/06/19  5:25 AM  Result Value Ref Range   D-Dimer, Quant 2.31 (H) 0.00 - 0.50 ug/mL-FEU    Comment: (NOTE) At the manufacturer cut-off of 0.50 ug/mL FEU, this assay has been documented to exclude PE with a sensitivity and negative predictive value of 97 to 99%.  At this time, this assay has not been approved by the FDA to exclude DVT/VTE. Results should be correlated with clinical presentation. Performed at [facility], [address] [place], [place] [address]   Ferritin     Status: Abnormal   Collection Time: 10/06/19  5:25 AM  Result Value Ref Range   Ferritin 614 (H) 24 - 336 ng/mL    Comment: Performed at [facility], [address] [place], [place] [address]   DG Chest Portable 1 View  Result Date: 10/05/2019 CLINICAL DATA:  Cough generalized body aches recent positive COVID-19 test. EXAM: PORTABLE CHEST 1 VIEW COMPARISON:  None FINDINGS: Cardiomediastinal contours accentuated by low depth of expansion.  Similar changes associated with prominence of hila on left and right due to low volume chest. Partial silhouetting of the left hemidiaphragm. Subtle increased interstitial markings/vascular crowding. No signs of dense consolidation or pleural effusion. Visualized skeletal structures are unremarkable. IMPRESSION: Low volume chest but with area in the left lung base suspicious for pneumonia Electronically Signed   By: Donzetta Kohut M.D.   On: 10/05/2019 18:05    Pending Labs Unresulted Labs (From admission, onward)    Start     Ordered   10/12/19 0500  Creatinine, serum  (enoxaparin (LOVENOX)    CrCl >/= 30 ml/min)  Weekly,   R    Comments: while on enoxaparin therapy    10/05/19 2254   10/06/19 0500  CBC with Differential/Platelet  Daily,   R     10/05/19 2254   10/06/19 0500  Comprehensive metabolic panel  Daily,   R     10/05/19 2254   10/06/19 0500  C-reactive protein  Daily,   R     10/05/19 2254   10/06/19 0500  D-dimer, quantitative (not at [facility])  Daily,   R     10/05/19 2254   10/06/19 0500  Ferritin  Daily,   R     10/05/19 2254          Vitals/Pain Today's Vitals   10/06/19 1215 10/06/19 1230 10/06/19 1300 10/06/19 1400  BP:   (!) 126/97 (!) 129/99  Pulse:  94 88 90 (!) 111  Resp: (!) 21 (!) 21 (!) 22 (!) 26  Temp:      TempSrc:      SpO2: 92% 95% 97% 95%  Weight:      Height:      PainSc:        Isolation Precautions Airborne and Contact precautions  Medications Medications  enoxaparin (LOVENOX) injection 40 mg (40 mg Subcutaneous Given 10/05/19 2333)  albuterol (VENTOLIN HFA) 108 (90 Base) MCG/ACT inhaler 2 puff (2 puffs Inhalation Given 10/06/19 1400)  dexamethasone (DECADRON) injection 6 mg (6 mg Intravenous Given 10/05/19 2333)  guaiFENesin-dextromethorphan (ROBITUSSIN DM) 100-10 MG/5ML syrup 10 mL (has no administration in time range)  chlorpheniramine-HYDROcodone (TUSSIONEX) 10-8 MG/5ML suspension 5 mL (has no administration in time range)  acetaminophen  (TYLENOL) tablet 650 mg (has no administration in time range)  ondansetron (ZOFRAN) tablet 4 mg (has no administration in time range)    Or  ondansetron (ZOFRAN) injection 4 mg (has no administration in time range)  remdesivir 200 mg in sodium chloride 0.9% 250 mL IVPB (0 mg Intravenous Stopped 10/06/19 0011)    Followed by  remdesivir 100 mg in sodium chloride 0.9 % 100 mL IVPB (0 mg Intravenous Stopped 10/06/19 1142)  famotidine (PEPCID) tablet 20 mg (20 mg Oral Given 10/06/19 1027)  0.9 %  sodium chloride infusion ( Intravenous Rate/Dose Verify 10/06/19 0852)  sodium chloride 0.9 % bolus 1,000 mL (0 mLs Intravenous Stopped 10/05/19 1931)    Mobility walks Low fall risk   Focused Assessments    R Recommendations: See Admitting Provider Note  Report given to:   Additional Notes:

## 2019-10-06 NOTE — Progress Notes (Signed)
PROGRESS NOTE    Ryan Buck  OMV:[id number] DOB: 1961/08/31 DOA: 10/05/2019 PCP: Lawerance Sabal, PA   Brief Narrative: 59 year old with past medical history significant for GERD, hypertension, morbid obesity who presented to the ER complaining of worsening shortness of breath, cough and generalized weakness.  Patient was diagnosed with COVID-19 on December 23.  Evaluation in the ED patient was tachycardic, chest x-ray: Showed low volume, left lower lung base suspicious for pneumonia.  Patient has been admitted for treatment of COVID-19 pneumonia.  Oxygen saturation 89 on room air on admission.  Assessment & Plan:   Principal Problem:   Pneumonia due to COVID-19 virus Active Problems:   GERD (gastroesophageal reflux disease)   Benign essential HTN   Morbid obesity (HCC)  1-Covid 19 PNA;  Patient presented with hypoxemia, oxygen saturation 89% on room air.  He was tachycardic on admission. -Continue with remdesivir and dexamethasone. -We will discontinue ceftriaxone and azithromycin, procalcitonin level low.  COVID-19 Labs  Recent Labs    10/05/19 2125 10/06/19 0124 10/06/19 0525  DDIMER 2.15*  --  2.31*  FERRITIN 561*  --  614*  LDH 192  --   --   CRP 6.0* 5.7* 5.2*    No results found for: SARSCOV2NAA 2-Dehydration, tachycardia: Patient with poor oral intake.  Continue with IV fluids for another 24 hours.  3-Morbid obesity: Need diet education.  4-GERD: Continue with PPI.  Estimated body mass index is 32.01 kg/m as calculated from the following:   Height as of this encounter: 6' (1.829 m).   Weight as of this encounter: 107 kg.   DVT prophylaxis: Lovenox Code Status: Full code Family Communication: care discussed with patient.  Disposition Plan: remain in the hospital for treatment of covid 19 PNA , hypoxemia.  Consultants:  none Procedures:   none  Antimicrobials:  Received one dose of ceftriaxone and azithromycin.   Subjective: He is feeling weak,  tired.  He report SOB  Objective: Vitals:   10/06/19 0400 10/06/19 0430 10/06/19 0500 10/06/19 0522  BP: 106/71 104/75 98/72   Pulse: 93   91  Resp:      Temp:      TempSrc:      SpO2: 91%   95%  Weight:      Height:        Intake/Output Summary (Last 24 hours) at 10/06/2019 0805 Last data filed at 10/06/2019 0122 Gross per 24 hour  Intake 1750 ml  Output --  Net 1750 ml   Filed Weights   10/05/19 1729  Weight: 107 kg    Examination:  General exam: Appears calm and comfortable  Respiratory system: Clear to auscultation. Respiratory effort normal. Cardiovascular system: S1 & S2 heard, RRR. No JVD, murmurs, rubs, gallops or clicks. No pedal edema. Gastrointestinal system: Abdomen is nondistended, soft and nontender. No organomegaly or masses felt. Normal bowel sounds heard. Central nervous system: Alert and oriented. No focal neurological deficits. Extremities: Symmetric 5 x 5 power. Skin: No rashes, lesions or ulcers Psychiatry: Judgement and insight appear normal. Mood & affect appropriate.     Data Reviewed: I have personally reviewed following labs and imaging studies  CBC: Recent Labs  Lab 10/05/19 1844 10/06/19 0124 10/06/19 0525  WBC 7.0 8.5 6.5  NEUTROABS 5.3  --  4.9  HGB 16.4 15.2 15.1  HCT 49.9 46.8 45.9  MCV 94.9 96.3 95.0  PLT 218 210 224   Basic Metabolic Panel: Recent Labs  Lab 10/05/19 1844 10/06/19 0124 10/06/19 0525  NA 139  --  139  K 4.5  --  4.4  CL 99  --  104  CO2 27  --  23  GLUCOSE 105*  --  154*  BUN 9  --  8  CREATININE 0.95 0.97 0.99  CALCIUM 9.0  --  8.5*   GFR: Estimated Creatinine Clearance: 101.6 mL/min (by C-G formula based on SCr of 0.99 mg/dL). Liver Function Tests: Recent Labs  Lab 10/05/19 1844 10/06/19 0525  AST 66* 53*  ALT 60* 52*  ALKPHOS 139* 123  BILITOT 0.7 0.5  PROT 7.4 6.7  ALBUMIN 3.5 3.0*   No results for input(s): LIPASE, AMYLASE in the last 168 hours. No results for input(s): AMMONIA in  the last 168 hours. Coagulation Profile: No results for input(s): INR, PROTIME in the last 168 hours. Cardiac Enzymes: No results for input(s): CKTOTAL, CKMB, CKMBINDEX, TROPONINI in the last 168 hours. BNP (last 3 results) No results for input(s): PROBNP in the last 8760 hours. HbA1C: No results for input(s): HGBA1C in the last 72 hours. CBG: No results for input(s): GLUCAP in the last 168 hours. Lipid Profile: Recent Labs    10/05/19 2126  TRIG 156*   Thyroid Function Tests: No results for input(s): TSH, T4TOTAL, FREET4, T3FREE, THYROIDAB in the last 72 hours. Anemia Panel: Recent Labs    10/05/19 2125 10/06/19 0525  FERRITIN 561* 614*   Sepsis Labs: Recent Labs  Lab 10/05/19 2125 10/05/19 2126 10/06/19 0124  PROCALCITON  --  <0.10  --   LATICACIDVEN 1.5  --  1.3    No results found for this or any previous visit (from the past 240 hour(s)).       Radiology Studies: DG Chest Portable 1 View  Result Date: 10/05/2019 CLINICAL DATA:  Cough generalized body aches recent positive COVID-19 test. EXAM: PORTABLE CHEST 1 VIEW COMPARISON:  None FINDINGS: Cardiomediastinal contours accentuated by low depth of expansion. Similar changes associated with prominence of hila on left and right due to low volume chest. Partial silhouetting of the left hemidiaphragm. Subtle increased interstitial markings/vascular crowding. No signs of dense consolidation or pleural effusion. Visualized skeletal structures are unremarkable. IMPRESSION: Low volume chest but with area in the left lung base suspicious for pneumonia Electronically Signed   By: Zetta Bills M.D.   On: 10/05/2019 18:05        Scheduled Meds: . albuterol  2 puff Inhalation Q6H  . dexamethasone (DECADRON) injection  6 mg Intravenous Q24H  . enoxaparin (LOVENOX) injection  40 mg Subcutaneous Q24H  . famotidine  20 mg Oral BID   Continuous Infusions: . sodium chloride 100 mL/hr at 10/05/19 2342  . remdesivir 100 mg  in NS 100 mL       LOS: 1 day    Time spent: 35 minutes     Miles Leyda A Hobart Marte, MD [facility]   If 7PM-7AM, please contact night-coverage [email or web address] Password [email or web address] 10/06/2019, 8:05 AM

## 2019-10-06 NOTE — ED Notes (Signed)
Tele   Breakfast ordered  

## 2019-10-06 NOTE — ED Notes (Signed)
PT at bedside ambulating patient in room

## 2019-10-06 NOTE — ED Notes (Signed)
Lunch Tray Ordered @ 1132.  

## 2019-10-06 NOTE — Progress Notes (Signed)
[place] 36 lbs lost since covid diagnosis on 12/23

## 2019-10-06 NOTE — Evaluation (Signed)
Physical Therapy Evaluation Patient Details Name: Ryan Buck MRN: [id number] DOB: 1961/08/01 Today's Date: 10/06/2019   History of Present Illness  Ryan Buck is a 59 y.o. male with medical history significant of GERD, hypertension, morbid obesity, who presented to the ER after tested positive for COVID-19 on December 23.  Admitted due to cough, SOB, weakness, tachycardia.  Clinical Impression  Patient presents with mobility limited due to fatigue, general weakness with acute illness.  Currently S for mobility in the room.  He feels better than previous due to less coughing with mobility.  Feel he will benefit from skilled PT during acute stay for activity progression and tolerance and general strengthening.  No follow up PT needs noted at this time.     Follow Up Recommendations      Equipment Recommendations  None recommended by PT    Recommendations for Other Services       Precautions / Restrictions Precautions Precaution Comments: watch HR      Mobility  Bed Mobility Overal bed mobility: Modified Independent                Transfers Overall transfer level: Modified independent               General transfer comment: sit to stand from stretcher  Ambulation/Gait Ambulation/Gait assistance: Supervision Gait Distance (Feet): 20 Feet(x 3) Assistive device: None Gait Pattern/deviations: Step-through pattern;Decreased stride length;Shuffle     General Gait Details: around bed in room x 3; short shuffling steps and assist for IV; HR max 126, some coughing with mobility, but pt reports is much better than it was  Financial trader Rank[employer] (Stroke Patients Only)       Balance Overall balance assessment: No apparent balance deficits (not formally assessed)                                           Pertinent Vitals/Pain Pain Assessment: Faces Faces Pain Scale: Hurts a little bit Pain Location:  cough Pain Descriptors / Indicators: Grimacing;Discomfort Pain Intervention(s): Monitored during session    Home Living Family/patient expects to be discharged to:: Private residence Living Arrangements: Spouse/significant other(fiance' and her daughter)   Type of Home: House Home Access: Level entry     Home Layout: One level Home Equipment: None Additional Comments: 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 Level of Independence: Independent               Journalist, newspaper        Extremity/Trunk Assessment   Upper Extremity Assessment Upper Extremity Assessment: Overall WFL for tasks assessed    Lower Extremity Assessment Lower Extremity Assessment: Overall WFL for tasks assessed       Communication   Communication: No difficulties  Cognition Arousal/Alertness: Awake/alert Behavior During Therapy: WFL for tasks assessed/performed Overall Cognitive Status: Within Functional Limits for tasks assessed                                        General Comments General comments (skin integrity, edema, etc.): reports just fatigued and hard to move due to significant malaise and not eating at home    Exercises  Assessment/Plan    PT Assessment Patient needs continued PT services  PT Problem List Decreased activity tolerance;Decreased mobility;Cardiopulmonary status limiting activity       PT Treatment Interventions Therapeutic activities;Balance training;Patient/family education;Therapeutic exercise;Functional mobility training;Gait training    PT Goals (Current goals can be found in the Care Plan section)  Acute Rehab PT Goals Patient Stated Goal: to go home and feel better PT Goal Formulation: With patient Time For Goal Achievement: 10/20/19 Potential to Achieve Goals: Good    Frequency Min 3X/week   Barriers to discharge        Co-evaluation               AM-PAC PT "6 Clicks"  Mobility  Outcome Measure Help needed turning from your back to your side while in a flat bed without using bedrails?: None Help needed moving from lying on your back to sitting on the side of a flat bed without using bedrails?: None Help needed moving to and from a bed to a chair (including a wheelchair)?: A Little Help needed standing up from a chair using your arms (e.g., wheelchair or bedside chair)?: None Help needed to walk in hospital room?: A Little Help needed climbing 3-5 steps with a railing? : A Little 6 Click Score: 21    End of Session   Activity Tolerance: Patient limited by fatigue Patient left: in bed;with call bell/phone within reach   PT Visit Diagnosis: Difficulty in walking, not elsewhere classified (R26.2)    Time: 1010-1041 PT Time Calculation (min) (ACUTE ONLY): 31 min   Charges:   PT Evaluation $PT Eval Low Complexity: 1 Low PT Treatments $Gait Training: 8-22 mins        Sheran Lawless, PT Acute Rehabilitation Services [phone] 10/06/2019   Ryan Buck 10/06/2019, 11:03 AM

## 2019-10-06 NOTE — ED Provider Notes (Signed)
[facility] EMERGENCY DEPARTMENT Provider Note   CSN: [id number] Arrival date & time: 10/05/19  1607     History Chief Complaint  Patient presents with  . Cough  . Generalized Body Aches    Ryan Buck is a 59 y.o. male.  HPI      59yo male presents with concern for shortness of breath in setting of recent positive COVID 19 testing 12/23.  For the last 10 days has had continuing couhg. Feels short of breath as not able to take deep breath without coughing. Cough has been severe. CP with coughing. Has had 5 days of fever, continuing body aches, severe fatigue and decreased appetite. Has not been eating or drinking much over last week.  Did have n/v/diarrhea but these symptoms have improved.    Past Medical History:  Diagnosis Date  . Body mass index (bmi) 38.0-38.9, adult   . Chest pain   . Dysphagia   . GERD (gastroesophageal reflux disease)   . Headache   . Hypertension   . Otalgia, left ear   . Pharyngitis   . Sinus tachycardia   . Sore throat     Patient Active Problem List   Diagnosis Date Noted  . Pneumonia due to COVID-19 virus 10/05/2019  . GERD (gastroesophageal reflux disease) 10/05/2019  . Benign essential HTN 10/05/2019  . Morbid obesity (HCC) 10/05/2019    Past Surgical History:  Procedure Laterality Date  . BACK SURGERY         Family History  Problem Relation Age of Onset  . Heart disease Father   . CAD Father     Social History   Tobacco Use  . Smoking status: Former Smoker    Quit date: 12/08/1990    Years since quitting: 28.8  . Smokeless tobacco: Never Used  Substance Use Topics  . Alcohol use: Yes    Alcohol/week: 2.0 - 3.0 standard drinks    Types: 2 - 3 Cans of beer per week  . Drug use: Not on file    Home Medications Prior to Admission medications   Medication Sig Start Date End Date Taking? Authorizing Provider  buPROPion (WELLBUTRIN XL) 150 MG 24 hr tablet Take 150 mg by mouth daily.  10/09/18  Yes  [provider]  buPROPion (WELLBUTRIN XL) 300 MG 24 hr tablet Take 300 mg by mouth daily.  10/09/18  Yes [provider]  busPIRone (BUSPAR) 7.5 MG tablet Take 7.5 mg by mouth 2 (two) times daily.   Yes [provider]  celecoxib (CELEBREX) 100 MG capsule Take 100 mg by mouth 2 (two) times daily. 05/31/17  Yes [provider]  hydrOXYzine (ATARAX/VISTARIL) 50 MG tablet Take 50 mg by mouth 3 (three) times daily.    Yes [provider]  losartan-hydrochlorothiazide (HYZAAR) 100-12.5 MG tablet Take 1 tablet by mouth daily.   Yes [provider]  metoprolol tartrate (LOPRESSOR) 50 MG tablet Take 50 mg by mouth 2 (two) times daily.    Yes [provider]    Allergies    Patient has no known allergies.  Review of Systems   Review of Systems  Constitutional: Positive for activity change, appetite change, fatigue and fever.  Eyes: Negative for visual disturbance.  Respiratory: Positive for cough and shortness of breath.   Cardiovascular: Negative for chest pain.  Gastrointestinal: Negative for abdominal pain, diarrhea (did have, resolved), nausea and vomiting.  Genitourinary: Negative for difficulty urinating.  Musculoskeletal: Negative for back pain and neck  stiffness.  Skin: Negative for rash.  Neurological: Negative for syncope and headaches.    Physical Exam Updated Vital Signs BP (!) 131/94   Pulse 100   Temp 98.1 F (36.7 C) (Oral)   Resp (!) 24   Ht 6' (1.829 m)   Wt 107 kg   SpO2 93%   BMI 32.01 kg/m   Physical Exam Vitals and nursing note reviewed.  Constitutional:      General: He is not in acute distress.    Appearance: He is well-developed. He is not diaphoretic.  HENT:     Head: Normocephalic and atraumatic.  Eyes:     Conjunctiva/sclera: Conjunctivae normal.  Cardiovascular:     Rate and Rhythm: Normal rate and regular rhythm.     Heart sounds: Normal heart sounds. No murmur. No friction rub. No  gallop.   Pulmonary:     Effort: Pulmonary effort is normal. No respiratory distress.     Breath sounds: Normal breath sounds. No wheezing or rales.     Comments: Frequent cough Abdominal:     General: There is no distension.     Palpations: Abdomen is soft.     Tenderness: There is no abdominal tenderness. There is no guarding.  Musculoskeletal:     Cervical back: Normal range of motion.  Skin:    General: Skin is warm and dry.  Neurological:     Mental Status: He is alert and oriented to person, place, and time.     ED Results / Procedures / Treatments   Labs (all labs ordered are listed, but only abnormal results are displayed) Labs Reviewed  COMPREHENSIVE METABOLIC PANEL - Abnormal; Notable for the following components:      Result Value   Glucose, Bld 105 (*)    AST 66 (*)    ALT 60 (*)    Alkaline Phosphatase 139 (*)    All other components within normal limits  D-DIMER, QUANTITATIVE (NOT AT [facility]) - Abnormal; Notable for the following components:   D-Dimer, Quant 2.15 (*)    All other components within normal limits  FERRITIN - Abnormal; Notable for the following components:   Ferritin 561 (*)    All other components within normal limits  TRIGLYCERIDES - Abnormal; Notable for the following components:   Triglycerides 156 (*)    All other components within normal limits  FIBRINOGEN - Abnormal; Notable for the following components:   Fibrinogen 793 (*)    All other components within normal limits  C-REACTIVE PROTEIN - Abnormal; Notable for the following components:   CRP 6.0 (*)    All other components within normal limits  C-REACTIVE PROTEIN - Abnormal; Notable for the following components:   CRP 5.7 (*)    All other components within normal limits  COMPREHENSIVE METABOLIC PANEL - Abnormal; Notable for the following components:   Glucose, Bld 154 (*)    Calcium 8.5 (*)    Albumin 3.0 (*)    AST 53 (*)    ALT 52 (*)    All other components within normal limits   C-REACTIVE PROTEIN - Abnormal; Notable for the following components:   CRP 5.2 (*)    All other components within normal limits  D-DIMER, QUANTITATIVE (NOT AT [facility]) - Abnormal; Notable for the following components:   D-Dimer, Quant 2.31 (*)    All other components within normal limits  FERRITIN - Abnormal; Notable for the following components:   Ferritin 614 (*)    All other components within  normal limits  CULTURE, BLOOD (ROUTINE X 2)  CULTURE, BLOOD (ROUTINE X 2)  CBC WITH DIFFERENTIAL/PLATELET  LACTIC ACID, PLASMA  LACTIC ACID, PLASMA  LACTATE DEHYDROGENASE  PROCALCITONIN  HIV ANTIBODY (ROUTINE TESTING W REFLEX)  CBC  CREATININE, SERUM  CBC WITH DIFFERENTIAL/PLATELET  ABO/RH    EKG EKG Interpretation  Date/Time:  Monday October 05 2019 17:20:18 EST Ventricular Rate:  116 PR Interval:  158 QRS Duration: 100 QT Interval:  340 QTC Calculation: 472 R Axis:   -35 Text Interpretation: Sinus tachycardia Left axis deviation Incomplete right bundle branch block Cannot rule out Anterior infarct , age undetermined Abnormal ECG No previous ECGs available Confirmed by Alvira Monday ([id number]) on 10/05/2019 5:55:51 PM   Radiology DG Chest Portable 1 View  Result Date: 10/05/2019 CLINICAL DATA:  Cough generalized body aches recent positive COVID-19 test. EXAM: PORTABLE CHEST 1 VIEW COMPARISON:  None FINDINGS: Cardiomediastinal contours accentuated by low depth of expansion. Similar changes associated with prominence of hila on left and right due to low volume chest. Partial silhouetting of the left hemidiaphragm. Subtle increased interstitial markings/vascular crowding. No signs of dense consolidation or pleural effusion. Visualized skeletal structures are unremarkable. IMPRESSION: Low volume chest but with area in the left lung base suspicious for pneumonia Electronically Signed   By: Donzetta Kohut M.D.   On: 10/05/2019 18:05    Procedures Procedures (including critical care  time)  Medications Ordered in ED Medications  enoxaparin (LOVENOX) injection 40 mg (40 mg Subcutaneous Given 10/05/19 2333)  albuterol (VENTOLIN HFA) 108 (90 Base) MCG/ACT inhaler 2 puff (2 puffs Inhalation Given 10/06/19 0743)  dexamethasone (DECADRON) injection 6 mg (6 mg Intravenous Given 10/05/19 2333)  guaiFENesin-dextromethorphan (ROBITUSSIN DM) 100-10 MG/5ML syrup 10 mL (has no administration in time range)  chlorpheniramine-HYDROcodone (TUSSIONEX) 10-8 MG/5ML suspension 5 mL (has no administration in time range)  acetaminophen (TYLENOL) tablet 650 mg (has no administration in time range)  ondansetron (ZOFRAN) tablet 4 mg (has no administration in time range)    Or  ondansetron (ZOFRAN) injection 4 mg (has no administration in time range)  remdesivir 200 mg in sodium chloride 0.9% 250 mL IVPB (0 mg Intravenous Stopped 10/06/19 0011)    Followed by  remdesivir 100 mg in sodium chloride 0.9 % 100 mL IVPB (0 mg Intravenous Stopped 10/06/19 1142)  famotidine (PEPCID) tablet 20 mg (20 mg Oral Given 10/06/19 1027)  0.9 %  sodium chloride infusion ( Intravenous Rate/Dose Verify 10/06/19 0852)  sodium chloride 0.9 % bolus 1,000 mL (0 mLs Intravenous Stopped 10/05/19 1931)    ED Course  I have reviewed the triage vital signs and the nursing notes.  Pertinent labs & imaging results that were available during my care of the patient were reviewed by me and considered in my medical decision making (see chart for details).    MDM Rules/Calculators/A&P                      59yo male presents with concern for shortness of breath in setting of recent positive COVID 19 testing 12/23. XR with opacity, suspect secondary to COVID 19.   Gave fluid with concern for tachycardia secondary to dehydraiton without improvement.  DDimer elevated suspect at this time secondary to COVID 19 infection however would consider further PE work up if no improvement. Will admit for COVID 19 with tachycardia, tachypnea.  Final  Clinical Impression(s) / ED Diagnoses Final diagnoses:  COVID-19    Rx / DC Orders ED Discharge Orders  None       Alvira Monday, MD 10/06/19 1345

## 2019-10-06 NOTE — ED Notes (Signed)
Report given to Kathlene November, RN on 2W

## 2019-10-06 NOTE — ED Notes (Signed)
Pt is sinus tach on monitor 

## 2019-10-07 ENCOUNTER — Inpatient Hospital Stay (HOSPITAL_COMMUNITY): Payer: Managed Care, Other (non HMO)

## 2019-10-07 LAB — COMPREHENSIVE METABOLIC PANEL
ALT: 45 U/L — ABNORMAL HIGH (ref 0–44)
AST: 38 U/L (ref 15–41)
Albumin: 3.1 g/dL — ABNORMAL LOW (ref 3.5–5.0)
Alkaline Phosphatase: 103 U/L (ref 38–126)
Anion gap: 11 (ref 5–15)
BUN: 14 mg/dL (ref 6–20)
CO2: 23 mmol/L (ref 22–32)
Calcium: 9.2 mg/dL (ref 8.9–10.3)
Chloride: 105 mmol/L (ref 98–111)
Creatinine, Ser: 0.81 mg/dL (ref 0.61–1.24)
GFR calc Af Amer: >60 mL/min (ref >60)
GFR calc non Af Amer: >60 mL/min (ref >60)
Glucose, Bld: 153 mg/dL — ABNORMAL HIGH (ref 70–99)
Potassium: 4.5 mmol/L (ref 3.5–5.1)
Sodium: 139 mmol/L (ref 135–145)
Total Bilirubin: 0.3 mg/dL (ref 0.3–1.2)
Total Protein: 6.9 g/dL (ref 6.5–8.1)

## 2019-10-07 LAB — CBC WITH DIFFERENTIAL/PLATELET
Abs Immature Granulocytes: 0.05 10*3/uL (ref 0.00–0.07)
Basophils Absolute: 0 10*3/uL (ref 0.0–0.1)
Basophils Relative: 0 %
Eosinophils Absolute: 0 10*3/uL (ref 0.0–0.5)
Eosinophils Relative: 0 %
HCT: 43.6 % (ref 39.0–52.0)
Hemoglobin: 14.8 g/dL (ref 13.0–17.0)
Immature Granulocytes: 1 %
Lymphocytes Relative: 18 %
Lymphs Abs: 1.5 10*3/uL (ref 0.7–4.0)
MCH: 31.4 pg (ref 26.0–34.0)
MCHC: 33.9 g/dL (ref 30.0–36.0)
MCV: 92.6 fL (ref 80.0–100.0)
Monocytes Absolute: 0.3 10*3/uL (ref 0.1–1.0)
Monocytes Relative: 4 %
Neutro Abs: 6.3 10*3/uL (ref 1.7–7.7)
Neutrophils Relative %: 77 %
Platelets: 253 10*3/uL (ref 150–400)
RBC: 4.71 MIL/uL (ref 4.22–5.81)
RDW: 12.2 % (ref 11.5–15.5)
WBC: 8.1 10*3/uL (ref 4.0–10.5)
nRBC: 0 % (ref 0.0–0.2)

## 2019-10-07 LAB — D-DIMER, QUANTITATIVE: D-Dimer, Quant: 2.18 ug/mL-FEU — ABNORMAL HIGH (ref 0.00–0.50)

## 2019-10-07 LAB — C-REACTIVE PROTEIN: CRP: 2.1 mg/dL — ABNORMAL HIGH (ref <1.0)

## 2019-10-07 LAB — FERRITIN: Ferritin: 520 ng/mL — ABNORMAL HIGH (ref 24–336)

## 2019-10-07 MED ORDER — IOHEXOL 350 MG/ML SOLN
100.0000 mL | Freq: Once | INTRAVENOUS | Status: AC | PRN
  Administered 2019-10-07: 100 mL via INTRAVENOUS

## 2019-10-07 MED ORDER — ENSURE ENLIVE PO LIQD
237.0000 mL | Freq: Two times a day (BID) | ORAL | Status: DC
  Administered 2019-10-08 – 2019-10-09 (×3): 237 mL via ORAL

## 2019-10-07 MED ORDER — MELATONIN 3 MG PO TABS
6.0000 mg | ORAL_TABLET | Freq: Every evening | ORAL | Status: DC | PRN
  Administered 2019-10-07 – 2019-10-08 (×2): 6 mg via ORAL
  Filled 2019-10-07 (×3): qty 2

## 2019-10-07 MED ORDER — ADULT MULTIVITAMIN W/MINERALS CH
1.0000 | ORAL_TABLET | Freq: Every day | ORAL | Status: DC
  Administered 2019-10-08: 1 via ORAL
  Filled 2019-10-07: qty 1

## 2019-10-07 NOTE — Progress Notes (Signed)
Initial Nutrition Assessment  DOCUMENTATION CODES:   Obesity unspecified  INTERVENTION:   -Ensure Enlive po BID, each supplement provides 350 kcal and 20 grams of protein -Magic cup BID with meals, each supplement provides 290 kcal and 9 grams of protein -Multivitamin with minerals daily  NUTRITION DIAGNOSIS:   Increased nutrient needs related to acute illness(COVID-19 infection) as evidenced by estimated needs.  GOAL:   Patient will meet greater than or equal to 90% of their needs  MONITOR:   PO intake, Supplement acceptance, Labs, Weight trends, I & O's  REASON FOR ASSESSMENT:   Malnutrition Screening Tool    ASSESSMENT:   59 y.o. male with medical history significant of GERD, hypertension, morbid obesity, who presented to the ER after tested positive for COVID-19 on December 23.  He was trying to treat his symptoms at home symptomatically.  He has been taking Mucinex and Tylenol.  Also practicing quarantine.  Symptoms have continued to get worse.  Patient came in today with generalized body aches cough and shortness of breath. Admitted for symptomatic COVID-19.  **RD working remotely**  Patient been having symptoms of COVID-19 since 12/23. Pt's appetite began to decrease around 1 week PTA and pt was not eating well d/t worsening of symptoms such as fever, SOB and cough. Given increased needs from active COVID-19 infection, will order Ensure and Magic Cup supplements.   Per weight records, pt has lost 16 lbs since 3/10 (6% wt loss x 10 months, insignificant for time frame). Pt reports ~30 lbs of weight loss.   I/Os: +1850 ml since admit  Labs reviewed. Medications:  IV Zofran  NUTRITION - FOCUSED PHYSICAL EXAM:  Working remotely.  Diet Order:   Diet Order            Diet Heart Room service appropriate? Yes; Fluid consistency: Thin  Diet effective now              EDUCATION NEEDS:   No education needs have been identified at this time  Skin:  Skin  Assessment: Reviewed RN Assessment  Last BM:  1/4  Height:   Ht Readings from Last 1 Encounters:  10/05/19 6' (1.829 m)    Weight:   Wt Readings from Last 1 Encounters:  10/05/19 107 kg    Ideal Body Weight:  80.9 kg  BMI:  Body mass index is 32.01 kg/m.  Estimated Nutritional Needs:   Kcal:  2200-2400  Protein:  105-120g  Fluid:  2.2L/day  Tilda Franco, MS, RD, LDN Inpatient Clinical Dietitian Pager: [phone] After Hours Pager: [phone]

## 2019-10-07 NOTE — Progress Notes (Signed)
Daily Progress Note   Pt Had CT of chest. Patient felt nauseous after procedure. Patient tolerated ADLs well

## 2019-10-07 NOTE — Evaluation (Addendum)
Occupational Therapy Evaluation and Discharge Patient Details Name: Ryan Buck MRN: [id number] DOB: [birth year] Today's Date: 10/07/2019    History of Present Illness 59 yo male COVID + on 12/23 admitted with SOB, cough and generalized weakness. Pt with persistent sinus tachycardia and COVID PNA. PMH GERD HTN morbid obesity back surgery hx smoking 28 years   Clinical Impression   PTA patient independent. Admitted for above and limited by problem list below, including decreased activity tolerance and endurance. He demonstrates ability to complete ADLs, in room mobility and transfers at modified independent level.  SpO2 on RA at rest >90% but desaturating to 86% with activity, given min cueing for PLB recovers quickly to >90%.  Max HR 107. Educated on energy conservation techniques to incorporate into daily routine, and patient verbalized understanding.  Based on performance today, no further OT needs have been identified and OT will sign off. If further needs arise, please re-consult.     Follow Up Recommendations  No OT follow up    Equipment Recommendations  None recommended by OT    Recommendations for Other Services       Precautions / Restrictions Precautions Precaution Comments: watch HR      Mobility Bed Mobility Overal bed mobility: Modified Independent                Transfers Overall transfer level: Modified independent               General transfer comment: modified independent     Balance Overall balance assessment: No apparent balance deficits (not formally assessed)                                         ADL either performed or assessed with clinical judgement   ADL Overall ADL's : Modified independent                                       General ADL Comments: pt at modified independent level for transfers, in room mobility (even reports taking shower with RN today)      Vision   Vision Assessment?: No  apparent visual deficits     Perception     Praxis      Pertinent Vitals/Pain Pain Assessment: No/denies pain     Hand Dominance     Extremity/Trunk Assessment Upper Extremity Assessment Upper Extremity Assessment: Overall WFL for tasks assessed   Lower Extremity Assessment Lower Extremity Assessment: Defer to PT evaluation   Cervical / Trunk Assessment Cervical / Trunk Assessment: Normal   Communication Communication Communication: No difficulties   Cognition Arousal/Alertness: Awake/alert Behavior During Therapy: WFL for tasks assessed/performed Overall Cognitive Status: Within Functional Limits for tasks assessed                                     General Comments  patient educated on energy conservation techniques to incorporate into daily routine during recovery, educated on PLB techniques for focused breathing and SpO2 maintaince during activity (SpO2 at rest >90% but during activity dropped to 86%, given cueing for PLB recovers quickly to 92%)    Exercises     Shoulder Instructions      Home Living Family/patient expects to be discharged to:: Private  residence Living Arrangements: Spouse/significant other   Type of Home: House Home Access: Level entry     [place]: One level     Bathroom Shower/Tub: Occupational psychologist: [place]: None   Additional Comments: 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      Prior Functioning/Environment Level of Independence: Independent                 OT Problem List: Decreased activity tolerance;Cardiopulmonary status limiting activity      OT Treatment/Interventions:      OT Goals(Current goals can be found in the care plan section) Acute Rehab OT Goals Patient Stated Goal: to go home and feel better OT Goal Formulation: With patient  OT Frequency:     Barriers to D/C:            Co-evaluation               AM-PAC OT "6 Clicks" Daily Activity     Outcome Measure Help from another person eating meals?: None Help from another person taking care of personal grooming?: None Help from another person toileting, which includes using toliet, bedpan, or urinal?: None Help from another person bathing (including washing, rinsing, drying)?: None Help from another person to put on and taking off regular upper body clothing?: None Help from another person to put on and taking off regular lower body clothing?: None 6 Click Score: 24   End of Session Nurse Communication: Mobility status  Activity Tolerance: Patient tolerated treatment well Patient left: in chair;with call bell/phone within reach  OT Visit Diagnosis: Muscle weakness (generalized) (M62.81);Other abnormalities of gait and mobility (R26.89)                Time: 1446-1500 OT Time Calculation (min): 14 min Charges:  OT General Charges $OT Visit: 1 Visit OT Evaluation $OT Eval Low Complexity: 1 Low  Jolaine Artist, OT Acute Rehabilitation Services Pager [phone] Office [phone]   Delight Stare 10/07/2019, 3:16 PM

## 2019-10-07 NOTE — Progress Notes (Signed)
PROGRESS NOTE    Ryan Buck  ZMO:[id number] DOB: 1960/12/20 DOA: 10/05/2019 PCP: Lawerance Sabal, PA   Brief Narrative: 59 year old with past medical history significant for GERD, hypertension, morbid obesity who presented to the ER complaining of worsening shortness of breath, cough and generalized weakness.  Patient was diagnosed with COVID-19 on December 23.  Evaluation in the ED patient was tachycardic, chest x-ray: Showed low volume, left lower lung base suspicious for pneumonia.  Patient has been admitted for treatment of COVID-19 pneumonia.  Oxygen saturation 89 on room air on admission.  Assessment & Plan:   Principal Problem:   Pneumonia due to COVID-19 virus Active Problems:   GERD (gastroesophageal reflux disease)   Benign essential HTN   Morbid obesity (HCC)  1-Covid 19 PNA;  Patient presented with hypoxemia, oxygen saturation 89% on room air.  He was tachycardic on admission. -Continue with remdesivir and dexamethasone. -CTA ordered today, negative for PE or consolidation but showing ground glass opacities.  -He was on ceftriaxone and azithromycin but these have been discontinued due to low procalcitonin level low.  COVID-19 Labs  Recent Labs    10/05/19 2125 10/06/19 0124 10/06/19 0525 10/07/19 0410  DDIMER 2.15*  --  2.31* 2.18*  FERRITIN 561*  --  614* 520*  LDH 192  --   --   --   CRP 6.0* 5.7* 5.2* 2.1*    No results found for: SARSCOV2NAA 2-Dehydration, tachycardia: Patient with poor oral intake on presentation. Improving. S/p IV fluids. Tachycardia has resolved.   3-Morbid obesity: Need diet education.  4-GERD: Continue with PPI.  Estimated body mass index is 32.01 kg/m as calculated from the following:   Height as of this encounter: 6' (1.829 m).   Weight as of this encounter: 107 kg.   DVT prophylaxis: Lovenox Code Status: Full code Family Communication: care discussed with patient.  Disposition Plan: remain in the hospital for treatment of  covid 19 PNA , hypoxemia.  Consultants:  none Procedures:   none  Antimicrobials:  Received one dose of ceftriaxone and azithromycin.   Subjective: He is feeling slightly better. Tachycardia is improving. His O2 sats are improving.   Objective: Vitals:   10/06/19 1700 10/06/19 1740 10/06/19 2108 10/07/19 0527  BP: (!) 138/93 (!) 161/119 (!) 141/102 (!) 137/96  Pulse: (!) 103 (!) 110 94 87  Resp: (!) 23 20    Temp:  98.9 F (37.2 C)  98.4 F (36.9 C)  TempSrc:  Oral  Oral  SpO2: 93% 96%  94%  Weight:      Height:       No intake or output data in the 24 hours ending 10/07/19 1649 Filed Weights   10/05/19 1729  Weight: 107 kg    Examination:  General exam: Appears calm and comfortable  Respiratory system: Clear to auscultation. Respiratory effort normal. Cardiovascular system: S1 & S2 heard, RRR.  Gastrointestinal system: Abdomen is nondistended, soft and nontender.  Central nervous system: Alert and oriented. No focal neurological deficits. Extremities: Symmetric 5 x 5 power. Skin: No rashes, lesions or ulcers Psychiatry: Mood & affect appropriate.     Data Reviewed: I have personally reviewed following labs and imaging studies  CBC: Recent Labs  Lab 10/05/19 1844 10/06/19 0124 10/06/19 0525 10/07/19 0410  WBC 7.0 8.5 6.5 8.1  NEUTROABS 5.3  --  4.9 6.3  HGB 16.4 15.2 15.1 14.8  HCT 49.9 46.8 45.9 43.6  MCV 94.9 96.3 95.0 92.6  PLT 218 210 224 253  Basic Metabolic Panel: Recent Labs  Lab 10/05/19 1844 10/06/19 0124 10/06/19 0525 10/07/19 0410  NA 139  --  139 139  K 4.5  --  4.4 4.5  CL 99  --  104 105  CO2 27  --  23 23  GLUCOSE 105*  --  154* 153*  BUN 9  --  8 14  CREATININE 0.95 0.97 0.99 0.81  CALCIUM 9.0  --  8.5* 9.2   GFR: Estimated Creatinine Clearance: 124.2 mL/min (by C-G formula based on SCr of 0.81 mg/dL). Liver Function Tests: Recent Labs  Lab 10/05/19 1844 10/06/19 0525 10/07/19 0410  AST 66* 53* 38  ALT 60* 52* 45*   ALKPHOS 139* 123 103  BILITOT 0.7 0.5 0.3  PROT 7.4 6.7 6.9  ALBUMIN 3.5 3.0* 3.1*   No results for input(s): LIPASE, AMYLASE in the last 168 hours. No results for input(s): AMMONIA in the last 168 hours. Coagulation Profile: No results for input(s): INR, PROTIME in the last 168 hours. Cardiac Enzymes: No results for input(s): CKTOTAL, CKMB, CKMBINDEX, TROPONINI in the last 168 hours. BNP (last 3 results) No results for input(s): PROBNP in the last 8760 hours. HbA1C: No results for input(s): HGBA1C in the last 72 hours. CBG: No results for input(s): GLUCAP in the last 168 hours. Lipid Profile: Recent Labs    10/05/19 2126  TRIG 156*   Thyroid Function Tests: No results for input(s): TSH, T4TOTAL, FREET4, T3FREE, THYROIDAB in the last 72 hours. Anemia Panel: Recent Labs    10/06/19 0525 10/07/19 0410  FERRITIN 614* 520*   Sepsis Labs: Recent Labs  Lab 10/05/19 2125 10/05/19 2126 10/06/19 0124  PROCALCITON  --  <0.10  --   LATICACIDVEN 1.5  --  1.3    Recent Results (from the past 240 hour(s))  Blood Culture (routine x 2)     Status: None (Preliminary result)   Collection Time: 10/05/19  9:25 PM   Specimen: BLOOD  Result Value Ref Range Status   Specimen Description BLOOD LEFT ANTECUBITAL  Final   Special Requests   Final    BOTTLES DRAWN AEROBIC AND ANAEROBIC Blood Culture results may not be optimal due to an inadequate volume of blood received in culture bottles   Culture   Final    NO GROWTH 2 DAYS Performed at [facility] [address] [place], [place] [address]    Report Status PENDING  Incomplete  Blood Culture (routine x 2)     Status: None (Preliminary result)   Collection Time: 10/05/19 10:38 PM   Specimen: BLOOD  Result Value Ref Range Status   Specimen Description BLOOD SITE NOT SPECIFIED  Final   Special Requests   Final    BOTTLES DRAWN AEROBIC AND ANAEROBIC Blood Culture results may not be optimal due to an inadequate volume of  blood received in culture bottles   Culture   Final    NO GROWTH 2 DAYS Performed at [facility], [place] [address] [place], [place] [address]    Report Status PENDING  Incomplete  MRSA PCR Screening     Status: None   Collection Time: 10/06/19  6:16 PM   Specimen: Nasal Mucosa; Nasopharyngeal  Result Value Ref Range Status   MRSA by PCR NEGATIVE NEGATIVE Final    Comment:        The GeneXpert MRSA Assay (FDA approved for NASAL specimens only), is one component of a comprehensive MRSA colonization surveillance program. It is not  intended to diagnose MRSA infection nor to guide or monitor treatment for MRSA infections. Performed at [facility], [address] [place], [place] [address]          Radiology Studies: CT ANGIO CHEST PE W OR WO CONTRAST  Result Date: 10/07/2019 CLINICAL DATA:  COVID-19 positive, shortness of breath, worsening cough, rule out PE EXAM: CT ANGIOGRAPHY CHEST WITH CONTRAST TECHNIQUE: Multidetector CT imaging of the chest was performed using the standard protocol during bolus administration of intravenous contrast. Multiplanar CT image reconstructions and MIPs were obtained to evaluate the vascular anatomy. CONTRAST:  OMNIPAQUE IOHEXOL 350 MG/ML SOLN COMPARISON:  None. FINDINGS: Cardiovascular: Satisfactory opacification of the pulmonary arteries to the segmental level. No evidence of pulmonary embolism. Normal heart size. No pericardial effusion. Mediastinum/Nodes: No enlarged mediastinal, hilar, or axillary lymph nodes. Thyroid gland, trachea, and esophagus demonstrate no significant findings. Lungs/Pleura: Minimal, scattered ground-glass opacity, predominantly dependent. Trace pleural effusions. Upper Abdomen: No acute abnormality. Musculoskeletal: No chest wall abnormality. No acute or significant osseous findings. Review of the MIP images confirms the above findings. IMPRESSION: 1. Negative examination for pulmonary embolism. 2. Minimal,  scattered ground-glass opacity, predominantly dependent. Trace pleural effusions. Findings are generally in keeping with COVID-19 infection. Electronically Signed   By: Lauralyn Primes M.D.   On: 10/07/2019 11:24   DG Chest Portable 1 View  Result Date: 10/05/2019 CLINICAL DATA:  Cough generalized body aches recent positive COVID-19 test. EXAM: PORTABLE CHEST 1 VIEW COMPARISON:  None FINDINGS: Cardiomediastinal contours accentuated by low depth of expansion. Similar changes associated with prominence of hila on left and right due to low volume chest. Partial silhouetting of the left hemidiaphragm. Subtle increased interstitial markings/vascular crowding. No signs of dense consolidation or pleural effusion. Visualized skeletal structures are unremarkable. IMPRESSION: Low volume chest but with area in the left lung base suspicious for pneumonia Electronically Signed   By: Donzetta Kohut M.D.   On: 10/05/2019 18:05        Scheduled Meds: . albuterol  2 puff Inhalation Q6H  . busPIRone  7.5 mg Oral BID  . dexamethasone (DECADRON) injection  6 mg Intravenous Q24H  . enoxaparin (LOVENOX) injection  40 mg Subcutaneous Q24H  . famotidine  20 mg Oral BID  . feeding supplement (ENSURE ENLIVE)  237 mL Oral BID BM  . metoprolol tartrate  50 mg Oral BID  . multivitamin with minerals  1 tablet Oral Daily   Continuous Infusions: . remdesivir 100 mg in NS 100 mL Stopped (10/07/19 1134)     LOS: 2 days    Time spent: 35 minutes     Ky Barban, MD [facility]   If 7PM-7AM, please contact night-coverage [email or web address] Password [facility] 10/07/2019, 4:49 PM

## 2019-10-08 LAB — COMPREHENSIVE METABOLIC PANEL
ALT: 56 U/L — ABNORMAL HIGH (ref 0–44)
AST: 55 U/L — ABNORMAL HIGH (ref 15–41)
Albumin: 3.1 g/dL — ABNORMAL LOW (ref 3.5–5.0)
Alkaline Phosphatase: 88 U/L (ref 38–126)
Anion gap: 12 (ref 5–15)
BUN: 16 mg/dL (ref 6–20)
CO2: 22 mmol/L (ref 22–32)
Calcium: 9.1 mg/dL (ref 8.9–10.3)
Chloride: 104 mmol/L (ref 98–111)
Creatinine, Ser: 0.8 mg/dL (ref 0.61–1.24)
GFR calc Af Amer: >60 mL/min (ref >60)
GFR calc non Af Amer: >60 mL/min (ref >60)
Glucose, Bld: 147 mg/dL — ABNORMAL HIGH (ref 70–99)
Potassium: 4.3 mmol/L (ref 3.5–5.1)
Sodium: 138 mmol/L (ref 135–145)
Total Bilirubin: 0.4 mg/dL (ref 0.3–1.2)
Total Protein: 6.6 g/dL (ref 6.5–8.1)

## 2019-10-08 LAB — CBC WITH DIFFERENTIAL/PLATELET
Abs Immature Granulocytes: 0.05 10*3/uL (ref 0.00–0.07)
Basophils Absolute: 0 10*3/uL (ref 0.0–0.1)
Basophils Relative: 0 %
Eosinophils Absolute: 0 10*3/uL (ref 0.0–0.5)
Eosinophils Relative: 0 %
HCT: 42.1 % (ref 39.0–52.0)
Hemoglobin: 14 g/dL (ref 13.0–17.0)
Immature Granulocytes: 1 %
Lymphocytes Relative: 15 %
Lymphs Abs: 1.6 10*3/uL (ref 0.7–4.0)
MCH: 31.2 pg (ref 26.0–34.0)
MCHC: 33.3 g/dL (ref 30.0–36.0)
MCV: 93.8 fL (ref 80.0–100.0)
Monocytes Absolute: 0.3 10*3/uL (ref 0.1–1.0)
Monocytes Relative: 3 %
Neutro Abs: 8.8 10*3/uL — ABNORMAL HIGH (ref 1.7–7.7)
Neutrophils Relative %: 81 %
Platelets: 292 10*3/uL (ref 150–400)
RBC: 4.49 MIL/uL (ref 4.22–5.81)
RDW: 12.2 % (ref 11.5–15.5)
WBC: 10.7 10*3/uL — ABNORMAL HIGH (ref 4.0–10.5)
nRBC: 0 % (ref 0.0–0.2)

## 2019-10-08 LAB — C-REACTIVE PROTEIN: CRP: 0.9 mg/dL (ref <1.0)

## 2019-10-08 LAB — FERRITIN: Ferritin: 553 ng/mL — ABNORMAL HIGH (ref 24–336)

## 2019-10-08 LAB — D-DIMER, QUANTITATIVE: D-Dimer, Quant: 2.15 ug/mL-FEU — ABNORMAL HIGH (ref 0.00–0.50)

## 2019-10-08 NOTE — Progress Notes (Signed)
PROGRESS NOTE    Ryan Buck  VPX:[id number] DOB: [birth year] DOA: 10/05/2019 PCP: Lawerance Sabal, PA   Brief Narrative: 59 year old with past medical history significant for GERD, hypertension, morbid obesity who presented to the ER complaining of worsening shortness of breath, cough and generalized weakness.  Patient was diagnosed with COVID-19 on December 23.  Evaluation in the ED patient was tachycardic, chest x-ray: Showed low volume, left lower lung base suspicious for pneumonia.  Patient has been admitted for treatment of COVID-19 pneumonia.  Oxygen saturation 89 on room air on admission.  Assessment & Plan:   Principal Problem:   Pneumonia due to COVID-19 virus Active Problems:   GERD (gastroesophageal reflux disease)   Benign essential HTN   Morbid obesity (HCC)  1-Covid 19 PNA;  Patient presented with hypoxemia, oxygen saturation 89% on room air.  He was tachycardic on admission. -Continue with remdesivir and dexamethasone. -CTA negative for PE or consolidation but showing ground glass opacities.  -He was on ceftriaxone and azithromycin but these have been discontinued due to low procalcitonin level low.  COVID-19 Labs  Recent Labs    10/05/19 2125 10/06/19 0525 10/07/19 0410 10/08/19 0707  DDIMER 2.15* 2.31* 2.18* 2.15*  FERRITIN 561* 614* 520* 553*  LDH 192  --   --   --   CRP 6.0* 5.2* 2.1* 0.9    No results found for: SARSCOV2NAA 2-Dehydration, tachycardia: Patient with poor oral intake on presentation. Improving. S/p IV fluids. Tachycardia has resolved.   3-Morbid obesity: Needs diet education.  4-GERD: Continue with PPI.  Estimated body mass index is 32.01 kg/m as calculated from the following:   Height as of this encounter: 6' (1.829 m).   Weight as of this encounter: 107 kg.   DVT prophylaxis: Lovenox Code Status: Full code Family Communication: care discussed with patient.  Disposition Plan: remain in the hospital for treatment of covid 19 PNA ,  hypoxemia.  Consultants:  none Procedures:   none  Antimicrobials:  Received one dose of ceftriaxone and azithromycin.   Subjective: He is feeling slightly better. Reports that his shortness of breath has much improved.   Objective: Vitals:   10/07/19 0527 10/07/19 1754 10/07/19 2220 10/08/19 0918  BP: (!) 137/96 130/88 125/81 127/90  Pulse: 87 76 86 89  Resp:  (!) 22  16  Temp: 98.4 F (36.9 C) 97.8 F (36.6 C) (!) 97.5 F (36.4 C) 97.9 F (36.6 C)  TempSrc: Oral Oral Oral Oral  SpO2: 94% 97% 98% 95%  Weight:      Height:       No intake or output data in the 24 hours ending 10/08/19 1755 Filed Weights   10/05/19 1729  Weight: 107 kg    Examination:  General exam: Appears calm and comfortable  Respiratory system: Respiratory effort normal. No wheezing.  Cardiovascular system: S1 & S2 present, RRR.  Gastrointestinal system: Abdomen is obese, soft and nontender.  Central nervous system: Alert and oriented. No focal neurological deficits. Extremities: Symmetric 5 x 5 power. Skin: No rashes, lesions or ulcers Psychiatry: Mood & affect appropriate.     Data Reviewed: I have personally reviewed following labs and imaging studies  CBC: Recent Labs  Lab 10/05/19 1844 10/06/19 0124 10/06/19 0525 10/07/19 0410 10/08/19 0707  WBC 7.0 8.5 6.5 8.1 10.7*  NEUTROABS 5.3  --  4.9 6.3 8.8*  HGB 16.4 15.2 15.1 14.8 14.0  HCT 49.9 46.8 45.9 43.6 42.1  MCV 94.9 96.3 95.0 92.6 93.8  PLT 218  210 224 253 292   Basic Metabolic Panel: Recent Labs  Lab 10/05/19 1844 10/06/19 0124 10/06/19 0525 10/07/19 0410 10/08/19 0707  NA 139  --  139 139 138  K 4.5  --  4.4 4.5 4.3  CL 99  --  104 105 104  CO2 27  --  23 23 22   GLUCOSE 105*  --  154* 153* 147*  BUN 9  --  8 14 16   CREATININE 0.95 0.97 0.99 0.81 0.80  CALCIUM 9.0  --  8.5* 9.2 9.1   GFR: Estimated Creatinine Clearance: 125.7 mL/min (by C-G formula based on SCr of 0.8 mg/dL). Liver Function Tests: Recent  Labs  Lab 10/05/19 1844 10/06/19 0525 10/07/19 0410 10/08/19 0707  AST 66* 53* 38 55*  ALT 60* 52* 45* 56*  ALKPHOS 139* 123 103 88  BILITOT 0.7 0.5 0.3 0.4  PROT 7.4 6.7 6.9 6.6  ALBUMIN 3.5 3.0* 3.1* 3.1*   No results for input(s): LIPASE, AMYLASE in the last 168 hours. No results for input(s): AMMONIA in the last 168 hours. Coagulation Profile: No results for input(s): INR, PROTIME in the last 168 hours. Cardiac Enzymes: No results for input(s): CKTOTAL, CKMB, CKMBINDEX, TROPONINI in the last 168 hours. BNP (last 3 results) No results for input(s): PROBNP in the last 8760 hours. HbA1C: No results for input(s): HGBA1C in the last 72 hours. CBG: No results for input(s): GLUCAP in the last 168 hours. Lipid Profile: Recent Labs    10/05/19 2126  TRIG 156*   Thyroid Function Tests: No results for input(s): TSH, T4TOTAL, FREET4, T3FREE, THYROIDAB in the last 72 hours. Anemia Panel: Recent Labs    10/07/19 0410 10/08/19 0707  FERRITIN 520* 553*   Sepsis Labs: Recent Labs  Lab 10/05/19 2125 10/05/19 2126 10/06/19 0124  PROCALCITON  --  <0.10  --   LATICACIDVEN 1.5  --  1.3    Recent Results (from the past 240 hour(s))  Blood Culture (routine x 2)     Status: None (Preliminary result)   Collection Time: 10/05/19  9:25 PM   Specimen: BLOOD  Result Value Ref Range Status   Specimen Description BLOOD LEFT ANTECUBITAL  Final   Special Requests   Final    BOTTLES DRAWN AEROBIC AND ANAEROBIC Blood Culture results may not be optimal due to an inadequate volume of blood received in culture bottles   Culture   Final    NO GROWTH 3 DAYS Performed at [facility], [address] [place], [address] [place]    Report Status PENDING  Incomplete  Blood Culture (routine x 2)     Status: None (Preliminary result)   Collection Time: 10/05/19 10:38 PM   Specimen: BLOOD  Result Value Ref Range Status   Specimen Description BLOOD SITE NOT SPECIFIED  Final   Special  Requests   Final    BOTTLES DRAWN AEROBIC AND ANAEROBIC Blood Culture results may not be optimal due to an inadequate volume of blood received in culture bottles   Culture   Final    NO GROWTH 3 DAYS Performed at [facility], [address] [place], [address] [place]    Report Status PENDING  Incomplete  MRSA PCR Screening     Status: None   Collection Time: 10/06/19  6:16 PM   Specimen: Nasal Mucosa; Nasopharyngeal  Result Value Ref Range Status   MRSA by PCR NEGATIVE NEGATIVE Final    Comment:  The GeneXpert MRSA Assay (FDA approved for NASAL specimens only), is one component of a comprehensive MRSA colonization surveillance program. It is not intended to diagnose MRSA infection nor to guide or monitor treatment for MRSA infections. Performed at [facility], [place] [address], [place], [place] [address]          Radiology Studies: CT ANGIO CHEST PE W OR WO CONTRAST  Result Date: 10/07/2019 CLINICAL DATA:  COVID-19 positive, shortness of breath, worsening cough, rule out PE EXAM: CT ANGIOGRAPHY CHEST WITH CONTRAST TECHNIQUE: Multidetector CT imaging of the chest was performed using the standard protocol during bolus administration of intravenous contrast. Multiplanar CT image reconstructions and MIPs were obtained to evaluate the vascular anatomy. CONTRAST:  170mL OMNIPAQUE IOHEXOL 350 MG/ML SOLN COMPARISON:  None. FINDINGS: Cardiovascular: Satisfactory opacification of the pulmonary arteries to the segmental level. No evidence of pulmonary embolism. Normal heart size. No pericardial effusion. Mediastinum/Nodes: No enlarged mediastinal, hilar, or axillary lymph nodes. Thyroid gland, trachea, and esophagus demonstrate no significant findings. Lungs/Pleura: Minimal, scattered ground-glass opacity, predominantly dependent. Trace pleural effusions. Upper Abdomen: No acute abnormality. Musculoskeletal: No chest wall abnormality. No acute or significant osseous findings.  Review of the MIP images confirms the above findings. IMPRESSION: 1. Negative examination for pulmonary embolism. 2. Minimal, scattered ground-glass opacity, predominantly dependent. Trace pleural effusions. Findings are generally in keeping with COVID-19 infection. Electronically Signed   By: Eddie Candle M.D.   On: 10/07/2019 11:24        Scheduled Meds: . albuterol  2 puff Inhalation Q6H  . busPIRone  7.5 mg Oral BID  . dexamethasone (DECADRON) injection  6 mg Intravenous Q24H  . enoxaparin (LOVENOX) injection  40 mg Subcutaneous Q24H  . famotidine  20 mg Oral BID  . feeding supplement (ENSURE ENLIVE)  237 mL Oral BID BM  . metoprolol tartrate  50 mg Oral BID  . multivitamin with minerals  1 tablet Oral Daily   Continuous Infusions: . remdesivir 100 mg in NS 100 mL 100 mg (10/08/19 0844)     LOS: 3 days    Time spent: 27 minutes     Blain Pais, MD [facility]   If 7PM-7AM, please contact night-coverage [email or web address] Password [facility] 10/08/2019, 5:55 PM

## 2019-10-08 NOTE — Progress Notes (Signed)
Physical Therapy Treatment and Discharge Patient Details Name: Ryan Buck MRN: [id number] DOB: 1961/04/30 Today's Date: 10/08/2019    History of Present Illness 59 yo male COVID + on 12/23 admitted with SOB, cough and generalized weakness. Pt with persistent sinus tachycardia and COVID PNA. PMH GERD HTN morbid obesity back surgery hx smoking 28 years    PT Comments    Patient and RN report pt has been walking in his room independently without difficulty. Pt walked 75 ft in room independently on room air with sats 90-94%. Once pt sat to rest, his sats slowly declined to 88%. Patient instructed in pursed lip breathing and sats recovered to 92% in ~ 1 minute. By end of session pt's sats 94%. Pt able to recall OT discussion re: energy conservation and explained this was an example of why he needs to move more slowly and pace himself. Educated in use of IS and pt return demonstrated excellent technique. No further PT needs identified and discharging from PT.     Follow Up Recommendations  No PT follow up     Equipment Recommendations  None recommended by PT    Recommendations for Other Services       Precautions / Restrictions Precautions Precaution Comments: watch HR    Mobility  Bed Mobility                  Transfers Overall transfer level: Modified independent               General transfer comment: modified independent   Ambulation/Gait Ambulation/Gait assistance: Independent Gait Distance (Feet): 75 Feet Assistive device: None Gait Pattern/deviations: Step-through pattern;Decreased stride length     General Gait Details: 3 laps in room; pt walking at quick pace; vc for slower pace as sats dropping   Stairs             Wheelchair Mobility    Modified Rankin (Stroke Patients Only)       Balance Overall balance assessment: No apparent balance deficits (not formally assessed)                                           Cognition Arousal/Alertness: Awake/alert Behavior During Therapy: WFL for tasks assessed/performed Overall Cognitive Status: Within Functional Limits for tasks assessed                                        Exercises Other Exercises Other Exercises: issued and instructed in IS. Pt able to pull at appropriate speed. +generated coughing; educated to complete 5-10 breaths per hour    General Comments General comments (skin integrity, edema, etc.): educated in need to slow his walking down and showed him how his sats dropped even after he was seated and resting; discussed briefly energy conservation techniques and pt stated he had learned this yesterday with OT      Pertinent Vitals/Pain Pain Assessment: No/denies pain    Home Living                      Prior Function            PT Goals (current goals can now be found in the care plan section) Acute Rehab PT Goals Patient Stated Goal: to go home and feel better  Time For Goal Achievement: 10/20/19 Potential to Achieve Goals: Good Progress towards PT goals: Progressing toward goals(education completed; goals satisfactory for dc)    Frequency    Min 3X/week      PT Plan Current plan remains appropriate    Co-evaluation              AM-PAC PT "6 Clicks" Mobility   Outcome Measure  Help needed turning from your back to your side while in a flat bed without using bedrails?: None Help needed moving from lying on your back to sitting on the side of a flat bed without using bedrails?: None Help needed moving to and from a bed to a chair (including a wheelchair)?: None Help needed standing up from a chair using your arms (e.g., wheelchair or bedside chair)?: None Help needed to walk in hospital room?: None Help needed climbing 3-5 steps with a railing? : A Little 6 Click Score: 23    End of Session   Activity Tolerance: Patient tolerated treatment well Patient left: with call  bell/phone within reach;in chair Nurse Communication: Mobility status;Other (comment)(sats dipped ) PT Visit Diagnosis: Difficulty in walking, not elsewhere classified (R26.2)     Time: 4098-1191 PT Time Calculation (min) (ACUTE ONLY): 21 min  Charges:  $Therapeutic Activity: 8-22 mins                      Arby Barrette, PT Pager [phone]    Rexanne Mano 10/08/2019, 2:06 PM

## 2019-10-09 MED ORDER — ALBUTEROL SULFATE HFA 108 (90 BASE) MCG/ACT IN AERS: 2.0000 | INHALATION_SPRAY | Freq: Four times a day (QID) | RESPIRATORY_TRACT | 0 refills | Status: AC

## 2019-10-09 MED ORDER — DEXAMETHASONE 6 MG PO TABS: 6.0000 mg | ORAL_TABLET | Freq: Every day | ORAL | 0 refills | Status: DC

## 2019-10-09 MED ORDER — GUAIFENESIN-DM 100-10 MG/5ML PO SYRP: 10.0000 mL | ORAL_SOLUTION | ORAL | 0 refills | Status: DC | PRN

## 2019-10-09 NOTE — Discharge Summary (Signed)
Physician Discharge Summary  Ryan Buck KNL:[id number] DOB: 1961-05-19 DOA: 10/05/2019  PCP: Lawerance Sabal, PA  Admit date: 10/05/2019 Discharge date: 10/09/2019  Admitted From: Home  Disposition: Home   Recommendations for Outpatient Follow-up:  1. Follow up with PCP in 1-2 weeks 2. Please obtain CMP/CBC in one week   Home Health: No Equipment/Devices:No   Discharge Condition: Stable  CODE STATUS: Full  Diet recommendation: Heart Healthy   Brief/Interim Summary: 59 year old with past medical history significant for GERD, hypertension, morbid obesity who presented to the ER complaining of worsening shortness of breath, cough and generalized weakness.  Patient was diagnosed with COVID-19 on December 23.  Evaluation in the ED patient was tachycardic, chest x-ray: Showed low volume, left lower lung base suspicious for pneumonia.  Patient has been admitted for treatment of COVID-19 pneumonia.  Oxygen saturation 89 on room air on admission.  Discharge Diagnoses:  Principal Problem:   Pneumonia due to COVID-19 virus Active Problems:   GERD (gastroesophageal reflux disease)   Benign essential HTN   Morbid obesity (HCC)  1-Covid 19 PNA;  Patient presented with hypoxemia, oxygen saturation 89% on room air.  He was tachycardic on admission. -Completed remdesivir, will continue dexamethasone. -CTA chest negative for PE or consolidation but showing ground glass opacities.  -He was on ceftriaxone and azithromycin but these have been discontinued due to low procalcitonin level low.  COVID-19 Labs  Recent Labs (last 2 labs)         Recent Labs    10/05/19 2125 10/06/19 0124 10/06/19 0525 10/07/19 0410  DDIMER 2.15*  --  2.31* 2.18*  FERRITIN 561*  --  614* 520*  LDH 192  --   --   --   CRP 6.0* 5.7* 5.2* 2.1*      Recent Labs  No results found for: SARSCOV2NAA   2-Dehydration, tachycardia: Patient with poor oral intake on presentation. Improving. S/p IV fluids. Tachycardia  has resolved.   3-Morbid obesity: Needs lifestyle modification changes.   4-GERD: Continue PPI.  Estimated body mass index is 32.01 kg/m as calculated from the following:   Height as of this encounter: 6' (1.829 m).   Weight as of this encounter: 107 kg.    Discharge Instructions  Discharge Instructions    Diet - low sodium heart healthy   Complete by: As directed    Increase activity slowly   Complete by: As directed      Allergies as of 10/09/2019   No Known Allergies     Medication List    TAKE these medications   albuterol 108 (90 Base) MCG/ACT inhaler Commonly known as: VENTOLIN HFA Inhale 2 puffs into the lungs every 6 (six) hours.   buPROPion 150 MG 24 hr tablet Commonly known as: WELLBUTRIN XL Take 150 mg by mouth daily.   buPROPion 300 MG 24 hr tablet Commonly known as: WELLBUTRIN XL Take 300 mg by mouth daily.   busPIRone 7.5 MG tablet Commonly known as: BUSPAR Take 7.5 mg by mouth 2 (two) times daily.   celecoxib 100 MG capsule Commonly known as: CELEBREX Take 100 mg by mouth 2 (two) times daily.   dexamethasone 6 MG tablet Commonly known as: DECADRON Take 1 tablet (6 mg total) by mouth daily.   guaiFENesin-dextromethorphan 100-10 MG/5ML syrup Commonly known as: ROBITUSSIN DM Take 10 mLs by mouth every 4 (four) hours as needed for cough.   hydrOXYzine 50 MG tablet Commonly known as: ATARAX/VISTARIL Take 50 mg by mouth 3 (three) times daily.  losartan-hydrochlorothiazide 100-12.5 MG tablet Commonly known as: HYZAAR Take 1 tablet by mouth daily.   metoprolol tartrate 50 MG tablet Commonly known as: LOPRESSOR Take 50 mg by mouth 2 (two) times daily.      Follow-up Information    [place], [place]. Schedule an appointment as soon as possible for a visit in 1 week(s).   Specialty: Family Medicine Contact information: [address] [place] [address] [phone]          No Known  Allergies  Consultations:  None   Procedures/Studies: CT ANGIO CHEST PE W OR WO CONTRAST  Result Date: 10/07/2019 CLINICAL DATA:  COVID-19 positive, shortness of breath, worsening cough, rule out PE EXAM: CT ANGIOGRAPHY CHEST WITH CONTRAST TECHNIQUE: Multidetector CT imaging of the chest was performed using the standard protocol during bolus administration of intravenous contrast. Multiplanar CT image reconstructions and MIPs were obtained to evaluate the vascular anatomy. CONTRAST:  OMNIPAQUE IOHEXOL 350 MG/ML SOLN COMPARISON:  None. FINDINGS: Cardiovascular: Satisfactory opacification of the pulmonary arteries to the segmental level. No evidence of pulmonary embolism. Normal heart size. No pericardial effusion. Mediastinum/Nodes: No enlarged mediastinal, hilar, or axillary lymph nodes. Thyroid gland, trachea, and esophagus demonstrate no significant findings. Lungs/Pleura: Minimal, scattered ground-glass opacity, predominantly dependent. Trace pleural effusions. Upper Abdomen: No acute abnormality. Musculoskeletal: No chest wall abnormality. No acute or significant osseous findings. Review of the MIP images confirms the above findings. IMPRESSION: 1. Negative examination for pulmonary embolism. 2. Minimal, scattered ground-glass opacity, predominantly dependent. Trace pleural effusions. Findings are generally in keeping with COVID-19 infection. Electronically Signed   By: Lauralyn Primes M.D.   On: 10/07/2019 11:24   DG Chest Portable 1 View  Result Date: 10/05/2019 CLINICAL DATA:  Cough generalized body aches recent positive COVID-19 test. EXAM: PORTABLE CHEST 1 VIEW COMPARISON:  None FINDINGS: Cardiomediastinal contours accentuated by low depth of expansion. Similar changes associated with prominence of hila on left and right due to low volume chest. Partial silhouetting of the left hemidiaphragm. Subtle increased interstitial markings/vascular crowding. No signs of dense consolidation or pleural  effusion. Visualized skeletal structures are unremarkable. IMPRESSION: Low volume chest but with area in the left lung base suspicious for pneumonia Electronically Signed   By: Donzetta Kohut M.D.   On: 10/05/2019 18:05     Subjective: Patient reports that his dyspnea has improved. Has occasional cough.   Discharge Exam: Vitals:   10/08/19 2207 10/08/19 2330  BP: (!) 129/96 109/71  Pulse: 89 72  Resp:  17  Temp:  97.8 F (36.6 C)  SpO2:  94%   Vitals:   10/08/19 0918 10/08/19 1844 10/08/19 2207 10/08/19 2330  BP: 127/90 (!) 141/92 (!) 129/96 109/71  Pulse: 89 74 89 72  Resp: 16 16  17   Temp: 97.9 F (36.6 C) (!) 97.5 F (36.4 C)  97.8 F (36.6 C)  TempSrc: Oral Oral  Oral  SpO2: 95% 96%  94%  Weight:      Height:        General: Pt is alert, awake, not in acute distress Cardiovascular: RRR, S1/S2 Respiratory: No respiratory distress. No wheezing.  Abdominal: Soft, NT, ND Extremities: no edema, no cyanosis    The results of significant diagnostics from this hospitalization (including imaging, microbiology, ancillary and laboratory) are listed below for reference.     Microbiology: Recent Results (from the past 240 hour(s))  Blood Culture (routine x 2)     Status: None (Preliminary result)   Collection Time: 10/05/19  9:25 PM   Specimen: BLOOD  Result Value Ref Range Status   Specimen Description BLOOD LEFT ANTECUBITAL  Final   Special Requests   Final    BOTTLES DRAWN AEROBIC AND ANAEROBIC Blood Culture results may not be optimal due to an inadequate volume of blood received in culture bottles   Culture   Final    NO GROWTH 4 DAYS Performed at [facility] [address], [place], [place] [address]    Report Status PENDING  Incomplete  Blood Culture (routine x 2)     Status: None (Preliminary result)   Collection Time: 10/05/19 10:38 PM   Specimen: BLOOD  Result Value Ref Range Status   Specimen Description BLOOD SITE NOT SPECIFIED  Final   Special  Requests   Final    BOTTLES DRAWN AEROBIC AND ANAEROBIC Blood Culture results may not be optimal due to an inadequate volume of blood received in culture bottles   Culture   Final    NO GROWTH 4 DAYS Performed at [facility], [place] [address] [place], [place] [address]    Report Status PENDING  Incomplete  MRSA PCR Screening     Status: None   Collection Time: 10/06/19  6:16 PM   Specimen: Nasal Mucosa; Nasopharyngeal  Result Value Ref Range Status   MRSA by PCR NEGATIVE NEGATIVE Final    Comment:        The GeneXpert MRSA Assay (FDA approved for NASAL specimens only), is one component of a comprehensive MRSA colonization surveillance program. It is not intended to diagnose MRSA infection nor to guide or monitor treatment for MRSA infections. Performed at [facility], [place] [address] [place],  [address]      Labs: BNP (last 3 results) No results for input(s): BNP in the last 8760 hours. Basic Metabolic Panel: Recent Labs  Lab 10/05/19 1844 10/06/19 0124 10/06/19 0525 10/07/19 0410 10/08/19 0707  NA 139  --  139 139 138  K 4.5  --  4.4 4.5 4.3  CL 99  --  104 105 104  CO2 27  --  23 23 22   GLUCOSE 105*  --  154* 153* 147*  BUN 9  --  8 14 16   CREATININE 0.95 0.97 0.99 0.81 0.80  CALCIUM 9.0  --  8.5* 9.2 9.1   Liver Function Tests: Recent Labs  Lab 10/05/19 1844 10/06/19 0525 10/07/19 0410 10/08/19 0707  AST 66* 53* 38 55*  ALT 60* 52* 45* 56*  ALKPHOS 139* 123 103 88  BILITOT 0.7 0.5 0.3 0.4  PROT 7.4 6.7 6.9 6.6  ALBUMIN 3.5 3.0* 3.1* 3.1*   No results for input(s): LIPASE, AMYLASE in the last 168 hours. No results for input(s): AMMONIA in the last 168 hours. CBC: Recent Labs  Lab 10/05/19 1844 10/06/19 0124 10/06/19 0525 10/07/19 0410 10/08/19 0707  WBC 7.0 8.5 6.5 8.1 10.7*  NEUTROABS 5.3  --  4.9 6.3 8.8*  HGB 16.4 15.2 15.1 14.8 14.0  HCT 49.9 46.8 45.9 43.6 42.1  MCV 94.9 96.3 95.0 92.6 93.8  PLT 218 210 224 253 292    Cardiac Enzymes: No results for input(s): CKTOTAL, CKMB, CKMBINDEX, TROPONINI in the last 168 hours. BNP: Invalid input(s): POCBNP CBG: No results for input(s): GLUCAP in the last 168 hours. D-Dimer Recent Labs    10/07/19 0410 10/08/19 0707  DDIMER 2.18* 2.15*   Hgb A1c No results for input(s): HGBA1C in the last 72 hours. Lipid  Profile No results for input(s): CHOL, HDL, LDLCALC, TRIG, CHOLHDL, LDLDIRECT in the last 72 hours. Thyroid function studies No results for input(s): TSH, T4TOTAL, T3FREE, THYROIDAB in the last 72 hours.  Invalid input(s): FREET3 Anemia work up [employer]    10/07/19 0410 10/08/19 0707  FERRITIN 520* 553*   Urinalysis No results found for: COLORURINE, APPEARANCEUR, LABSPEC, PHURINE, GLUCOSEU, HGBUR, BILIRUBINUR, KETONESUR, PROTEINUR, UROBILINOGEN, NITRITE, LEUKOCYTESUR Sepsis Labs Invalid input(s): PROCALCITONIN,  WBC,  LACTICIDVEN Microbiology Recent Results (from the past 240 hour(s))  Blood Culture (routine x 2)     Status: None (Preliminary result)   Collection Time: 10/05/19  9:25 PM   Specimen: BLOOD  Result Value Ref Range Status   Specimen Description BLOOD LEFT ANTECUBITAL  Final   Special Requests   Final    BOTTLES DRAWN AEROBIC AND ANAEROBIC Blood Culture results may not be optimal due to an inadequate volume of blood received in culture bottles   Culture   Final    NO GROWTH 4 DAYS Performed at [facility], [address], [place], [place] [address]    Report Status PENDING  Incomplete  Blood Culture (routine x 2)     Status: None (Preliminary result)   Collection Time: 10/05/19 10:38 PM   Specimen: BLOOD  Result Value Ref Range Status   Specimen Description BLOOD SITE NOT SPECIFIED  Final   Special Requests   Final    BOTTLES DRAWN AEROBIC AND ANAEROBIC Blood Culture results may not be optimal due to an inadequate volume of blood received in culture bottles   Culture   Final    NO GROWTH 4 DAYS Performed at  [facility], [address], [place], [place] [address]    Report Status PENDING  Incomplete  MRSA PCR Screening     Status: None   Collection Time: 10/06/19  6:16 PM   Specimen: Nasal Mucosa; Nasopharyngeal  Result Value Ref Range Status   MRSA by PCR NEGATIVE NEGATIVE Final    Comment:        The GeneXpert MRSA Assay (FDA approved for NASAL specimens only), is one component of a comprehensive MRSA colonization surveillance program. It is not intended to diagnose MRSA infection nor to guide or monitor treatment for MRSA infections. Performed at [facility], [address], [place], [place] [address]      Time coordinating discharge: Over 33 minutes  SIGNED:   Ky Barban, MD  [facility] 10/09/2019, 12:12 PM   If 7PM-7AM, please contact night-coverage [email or web address] Password [email or web address]

## 2019-10-10 LAB — CULTURE, BLOOD (ROUTINE X 2)
Culture: NO GROWTH
Culture: NO GROWTH

## 2020-09-07 IMAGING — CT CT ANGIO CHEST
2 of 6 series · 18 of 46 positions shown · IV contrast (omnipaque)
Comparison: None.

CLINICAL DATA: 02WUP-OO positive, shortness of breath, worsening
cough, rule out PE

EXAM:
CT ANGIOGRAPHY CHEST WITH CONTRAST
TECHNIQUE: Multidetector CT imaging of the chest was performed using the
standard protocol during bolus administration of intravenous
contrast. Multiplanar CT image reconstructions and MIPs were
obtained to evaluate the vascular anatomy.
CONTRAST:  100mL OMNIPAQUE IOHEXOL 350 MG/ML SOLN

[Series 8: thins · axial · 0.74mm/px · z∈[+1073,+1317]mm · 15 of 268 slices shown]
[im 12/268  lung]
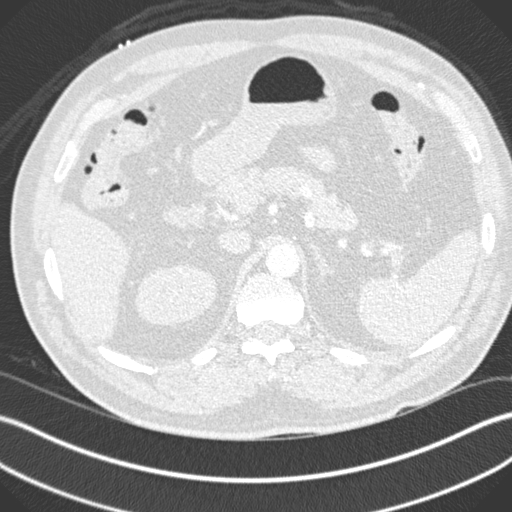
[im 35/268  soft-tissue]
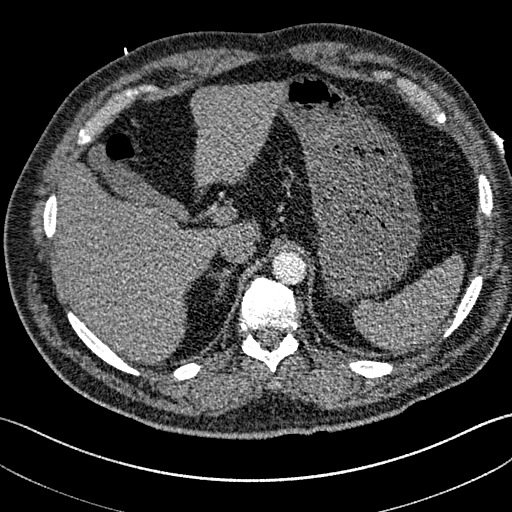
[im 47/268  lung]
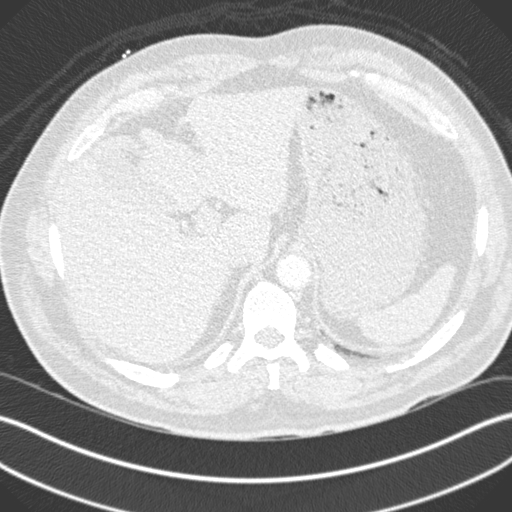
[im 70/268  soft-tissue]
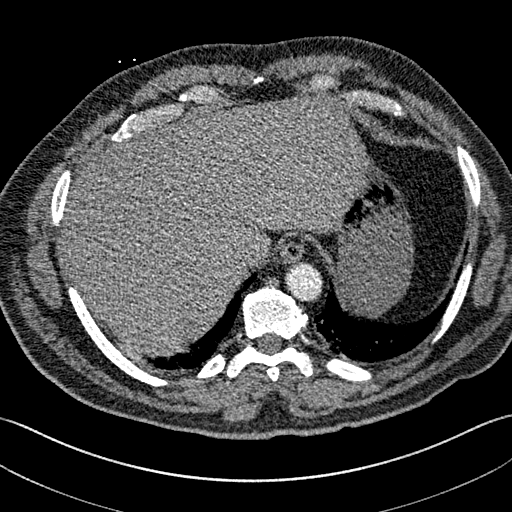
[im 82/268  lung]
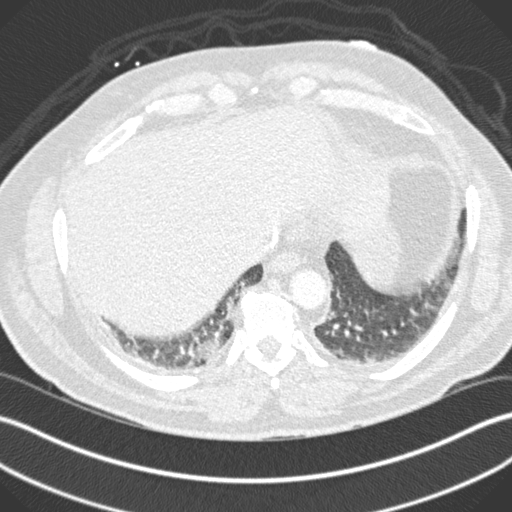
[im 105/268  soft-tissue]
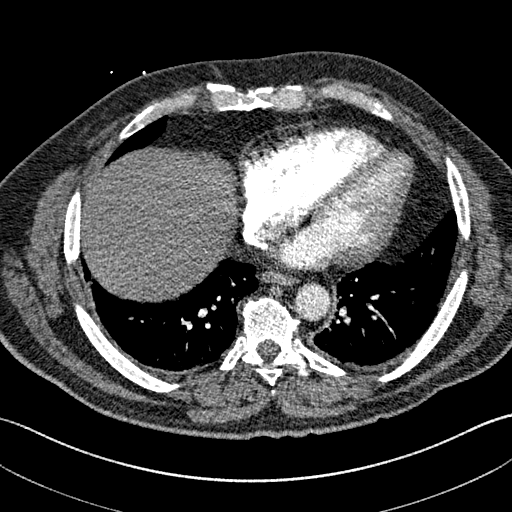
[im 117/268  lung]
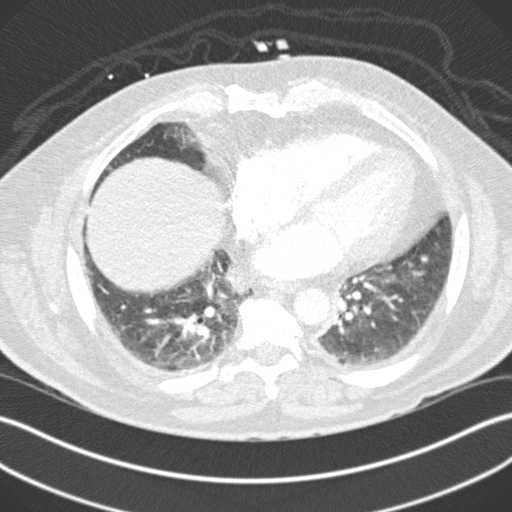
[im 140/268  soft-tissue]
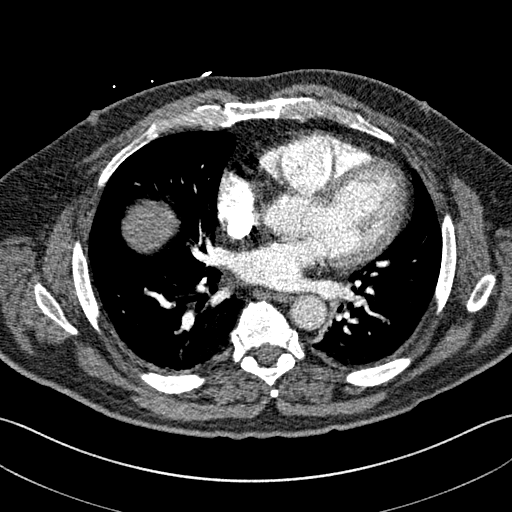
[im 151/268  lung]
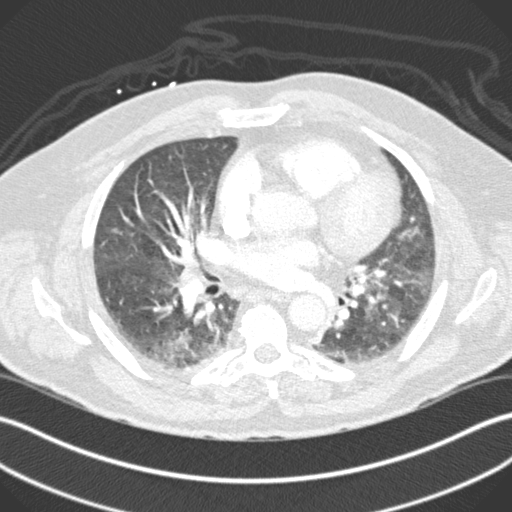
[im 163/268  soft-tissue]
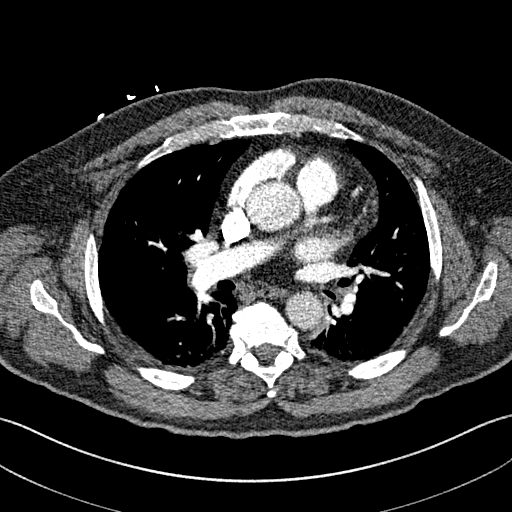
[im 186/268  lung]
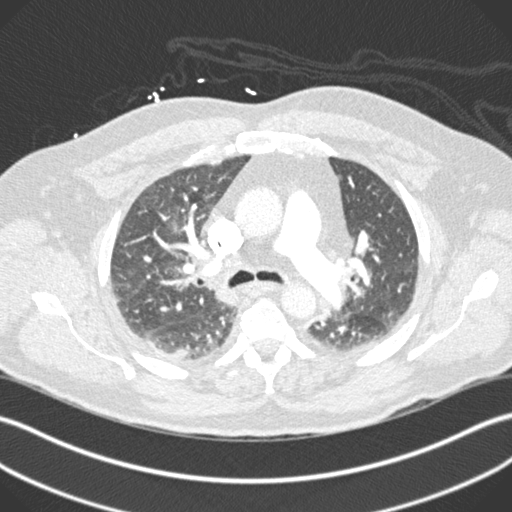
[im 198/268  soft-tissue]
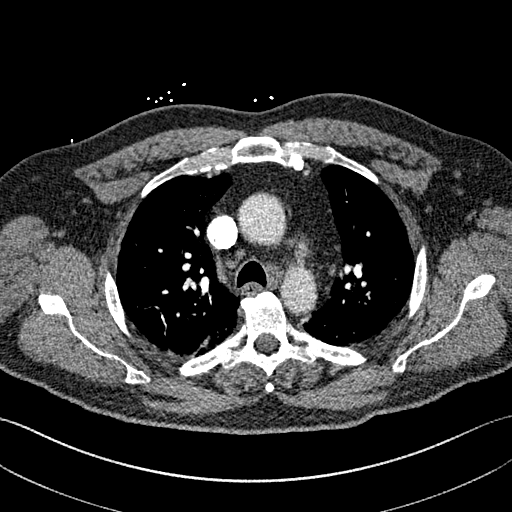
[im 221/268  lung]
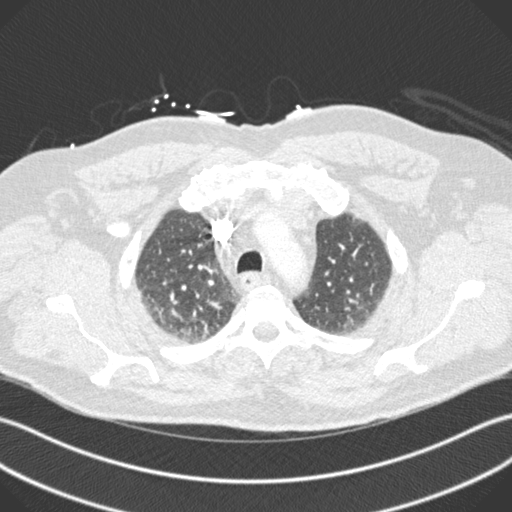
[im 233/268  soft-tissue]
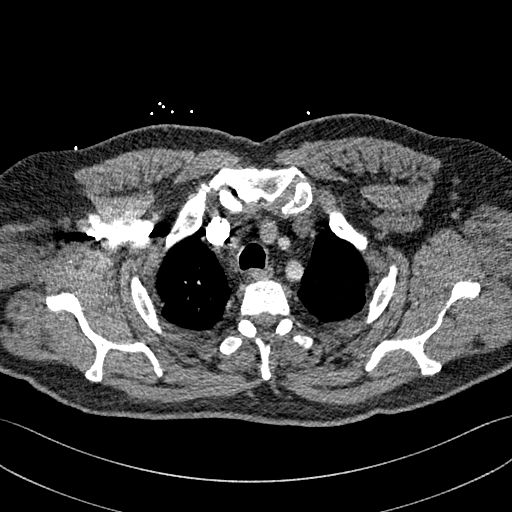
[im 256/268  lung]
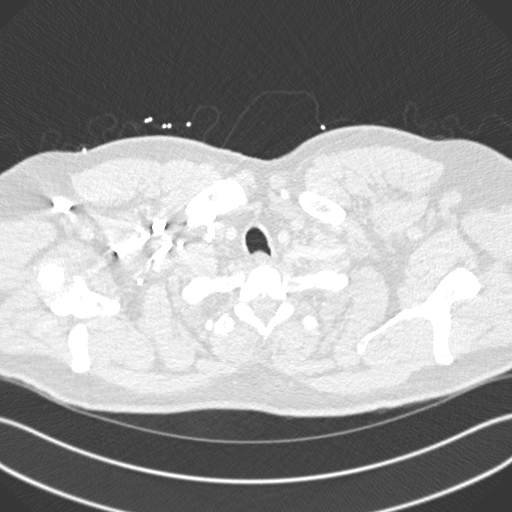

[Series 9: coronal mpr · coronal · 0.55mm/px · 3 of 145 slices shown]
[im 37/145  soft-tissue]
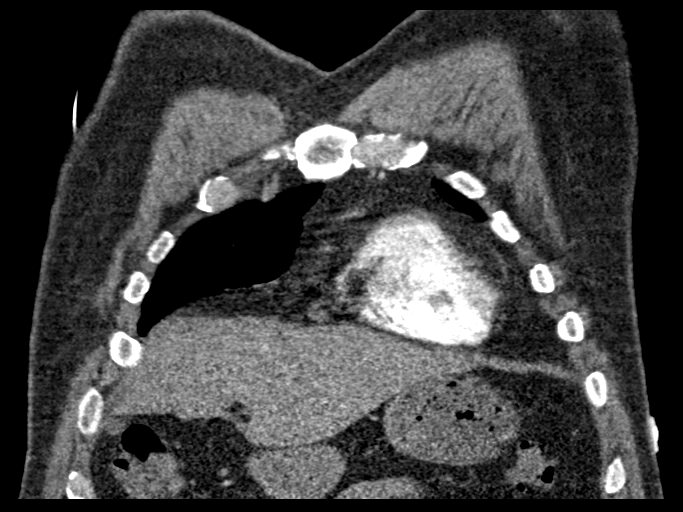
[im 73/145  soft-tissue]
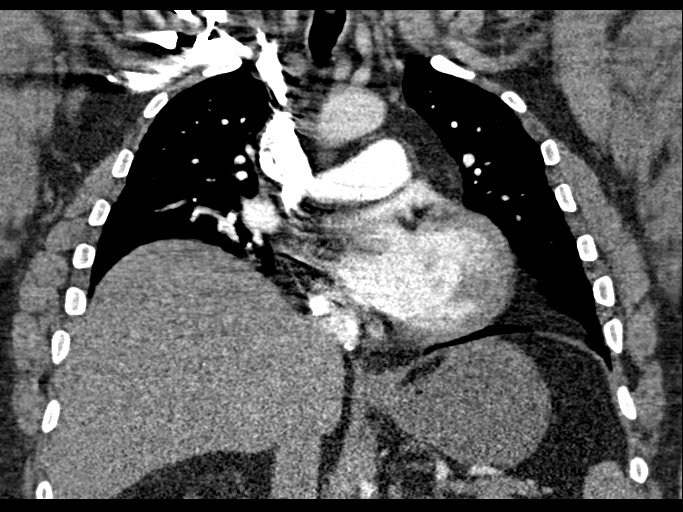
[im 109/145  soft-tissue]
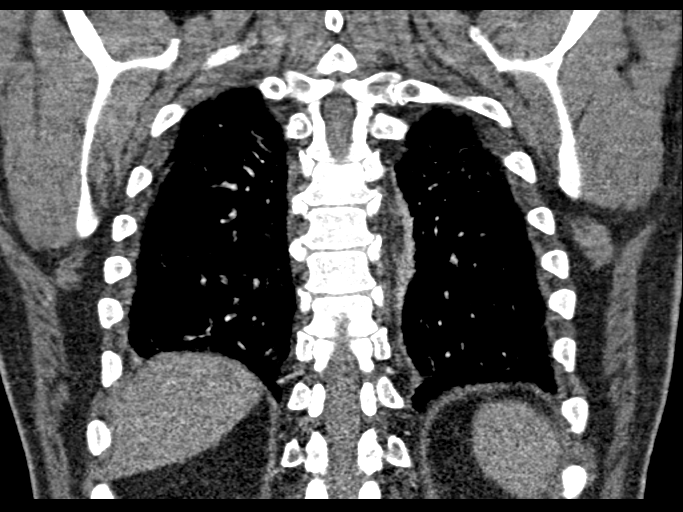

[18 of 46 positions shown; findings below may reference images not displayed]

FINDINGS: Cardiovascular: Satisfactory opacification of the pulmonary arteries
to the segmental level. No evidence of pulmonary embolism. Normal
heart size. No pericardial effusion.

Mediastinum/Nodes: No enlarged mediastinal, hilar, or axillary lymph
nodes. Thyroid gland, trachea, and esophagus demonstrate no
significant findings.

Lungs/Pleura: Minimal, scattered ground-glass opacity, predominantly
dependent. Trace pleural effusions.

Upper Abdomen: No acute abnormality.

Musculoskeletal: No chest wall abnormality. No acute or significant
osseous findings.

Review of the MIP images confirms the above findings.
IMPRESSION: 1. Negative examination for pulmonary embolism.
2. Minimal, scattered ground-glass opacity, predominantly dependent.
Trace pleural effusions. Findings are generally in keeping with
02WUP-OO infection.

## 2021-01-26 ENCOUNTER — Ambulatory Visit: Payer: Managed Care, Other (non HMO) | Admitting: Cardiology

## 2021-03-02 ENCOUNTER — Other Ambulatory Visit: Payer: Self-pay

## 2021-03-02 ENCOUNTER — Ambulatory Visit (INDEPENDENT_AMBULATORY_CARE_PROVIDER_SITE_OTHER): Payer: Managed Care, Other (non HMO) | Admitting: Cardiology

## 2021-03-02 ENCOUNTER — Encounter: Payer: Self-pay | Admitting: Cardiology

## 2021-03-02 VITALS — BP 142/108 | HR 81 | Ht 66.0 in | Wt 251.6 lb

## 2021-03-02 DIAGNOSIS — R0600 Dyspnea, unspecified: Secondary | ICD-10-CM | POA: Diagnosis not present

## 2021-03-02 DIAGNOSIS — R0609 Other forms of dyspnea: Secondary | ICD-10-CM

## 2021-03-02 DIAGNOSIS — R072 Precordial pain: Secondary | ICD-10-CM

## 2021-03-02 DIAGNOSIS — I10 Essential (primary) hypertension: Secondary | ICD-10-CM

## 2021-03-02 MED ORDER — METOPROLOL SUCCINATE ER 100 MG PO TB24: 100.0000 mg | ORAL_TABLET | Freq: Every day | ORAL | 2 refills | Status: DC

## 2021-03-02 NOTE — Progress Notes (Signed)
Cardiology Office Note    Date:  03/02/2021   ID:  Ryan Buck, DOB 17-Oct-1960, MRN [id number]  PCP:  Lawerance Sabal, PA  Cardiologist:  Armanda Magic, MD   Chief Complaint  Patient presents with  . Shortness of Breath  . Hypertension  . Chest Pain    History of Present Illness:  Ryan Buck is a 60 y.o. male  with a history of obesity, GERD and sinus tachycardia.  He was originally referred to be 2 years ago for chest pain that had been going on for years as well as DOE.  A coronary CTA was ordered but it was never done.  He is now here for further evaluation of DOE since having COVID 19 in Dec 2020 and then had COVID again in Feb 2022. His Chest CT in Jan 2021 showed minimal scattered ground-glass opacities and trivial pleural effusions c/w COVID infection.  He was started on Albuterol in Jan 2021 and then started on Symbicort by his PCP in Feb 2022 due to severe wheezing after the second episode of Covid 19.  He feels that his SOB has gotten better with the Symbicort.  He says that he has also been getting a lot of phlegm after eating due to GERD.    Now he is referred back to see me due to ongoing DOE and fatigue as well as intermittent chest pain.  He says that it is midsternal and radiates into his left and right arms.  It used to just dissipate on its own but now lasting longer.  He denies any associated sx of diaphoresis or nausea  It is nonexertional.  It sometimes wakes him up at night and he thinks it is related to his GERD and IBS.  He has intermittent LE edema.  He denies any palpitations or syncope.   Past Medical History:  Diagnosis Date  . Body mass index (bmi) 38.0-38.9, adult   . Chest pain   . Dysphagia   . GERD (gastroesophageal reflux disease)   . Headache   . Hypertension   . Otalgia, left ear   . Pharyngitis   . Sinus tachycardia   . Sore throat       Current Medications: Current Meds  Medication Sig  . albuterol (VENTOLIN HFA) 108 (90 Base) MCG/ACT  inhaler Inhale 2 puffs into the lungs every 6 (six) hours.  Marland Kitchen buPROPion (WELLBUTRIN XL) 150 MG 24 hr tablet Take 150 mg by mouth daily.   Marland Kitchen buPROPion (WELLBUTRIN XL) 300 MG 24 hr tablet Take 300 mg by mouth daily.   . celecoxib (CELEBREX) 100 MG capsule Take 100 mg by mouth 2 (two) times daily.  . hydrOXYzine (ATARAX/VISTARIL) 50 MG tablet Take 50 mg by mouth 3 (three) times daily.   . metoprolol tartrate (LOPRESSOR) 50 MG tablet Take 50 mg by mouth 2 (two) times daily.   . NON FORMULARY 1% testosterone gel  . pantoprazole (PROTONIX) 40 MG tablet Take 40 mg by mouth daily.  . SYMBICORT 160-4.5 MCG/ACT inhaler Inhale into the lungs.    Allergies:   Patient has no known allergies.   Social History   Socioeconomic History  . Marital status: Legally Separated    Spouse name: Not on file  . Number of children: Not on file  . Years of education: Not on file  . Highest education level: Not on file  Occupational History  . Not on file  Tobacco Use  . Smoking status: Former Engineer, civil (consulting)  date: 12/08/1990    Years since quitting: 30.2  . Smokeless tobacco: Never Used  Vaping Use  . Vaping Use: Never used  Substance and Sexual Activity  . Alcohol use: Yes    Alcohol/week: 2.0 - 3.0 standard drinks    Types: 2 - 3 Cans of beer per week  . Drug use: Not on file  . Sexual activity: Not on file  Other Topics Concern  . Not on file  Social History Narrative  . Not on file   Social Determinants of Health   Financial Resource Strain: Not on file  Food Insecurity: Not on file  Transportation Needs: Not on file  Physical Activity: Not on file  Stress: Not on file  Social Connections: Not on file     Family History:  The patient's family history includes CAD in his father; Heart disease in his father.   ROS:   Please see the history of present illness.    ROS All other systems reviewed and are negative.  No flowsheet data found.     PHYSICAL EXAM:   VS:  BP (!) 142/108    Pulse 81   Ht 5\' 6"  (1.676 m)   Wt 251 lb 9.6 oz (114.1 kg)   SpO2 98%   BMI 40.61 kg/m    =GEN: Well nourished, well developed in no acute distress HEENT: Normal NECK: No JVD; No carotid bruits LYMPHATICS: No lymphadenopathy CARDIAC:RRR, no murmurs, rubs, gallops RESPIRATORY:  Clear to auscultation without rales, wheezing or rhonchi  ABDOMEN: Soft, non-tender, non-distended MUSCULOSKELETAL:  No edema; No deformity  SKIN: Warm and dry NEUROLOGIC:  Alert and oriented x 3 PSYCHIATRIC:  Normal affect    Wt Readings from Last 3 Encounters:  03/02/21 251 lb 9.6 oz (114.1 kg)  10/05/19 236 lb (107 kg)  12/09/18 253 lb (114.8 kg)      Studies/Labs Reviewed:   EKG:  EKG is ordered today and showed NSR with iRBBB  Recent Labs: No results found for requested labs within last 8760 hours.   Lipid Panel    Component Value Date/Time   TRIG 156 (H) 10/05/2019 2126    Additional studies/ records that were reviewed today include:  Office notes from PCP    ASSESSMENT:    1. DOE (dyspnea on exertion)   2. Benign essential HTN      PLAN:  In order of problems listed above:  1.  DOE/chest pain -this seems to have occurred after COVID 19 infection in Dec 2020 and then worse after infection in Feb 2022 but somewhat improved with inhalers>>hest CT in 10/2019 c/w COVID infection -he also had problems with atypical CP that is nonexertional with no associated sx and occurs some at night.  He has GERD and I suspect this is related to GERD -he does have CRFs including HTN, remote tobacco use and fm hx of CAD -suspect that his DOE is more related to pulmonary source than cardiac -he was supposed to have a coronary CTA done for CP when I last saw him but cancelled it -check 2D echo to assess LVF and coronary CTA to rule out CAD  2.  HTN -BP is poorly controlled on exam today>>he has not taken his BP meds this am and just got off work -at home his BP runs around 125/65mHg -he forgets  to take his meds some days and dose not get both doses of metoprolol in each day -continue Hyzaar 100-12.5mg  daily  -change Lopressor tartrate 50mg  BID to  Toprol XL 100mg  daily for better compliance with meds -I have asked him to take his BP daily for a week when he gets up in the afternoon (he works 3rd shift) -encouraged him to be compliant with his meds   Medication Adjustments/Labs and Tests Ordered: Current medicines are reviewed at length with the patient today.  Concerns regarding medicines are outlined above.  Medication changes, Labs and Tests ordered today are listed in the Patient Instructions below.  There are no Patient Instructions on file for this visit.   Signed, , MD  03/02/2021 10:18 AM    [facility] [address] [place], [place], [place]  [place] Phone: [phone]; Fax: [phone]

## 2021-03-02 NOTE — Patient Instructions (Addendum)
Medication Instructions:  START TOPROL XL 100 MG daily STOP LOPRESSOR *If you need a refill on your cardiac medications before your next appointment, please call your pharmacy*   Lab Work: None If you have labs (blood work) drawn today and your tests are completely normal, you will receive your results only by: Marland Kitchen MyChart Message (if you have MyChart) OR . A paper copy in the mail If you have any lab test that is abnormal or we need to change your treatment, we will call you to review the results.   Testing/Procedures: Your physician has requested that you have an echocardiogram. Echocardiography is a painless test that uses sound waves to create images of your heart. It provides your doctor with information about the size and shape of your heart and how well your heart's chambers and valves are working. This procedure takes approximately one hour. There are no restrictions for this procedure.  Provider requests you take your blood pressure daily for 1 week and let us know the results.      Follow-Up: At [facility], you and your health needs are our priority.  As part of our continuing mission to provide you with exceptional heart care, we have created designated Provider Care Teams.  These Care Teams include your primary Cardiologist (physician) and Advanced Practice Providers (APPs -  Physician Assistants and Nurse Practitioners) who all work together to provide you with the care you need, when you need it.  We recommend signing up for the patient portal called "MyChart".  Sign up information is provided on this After Visit Summary.  MyChart is used to connect with patients for Virtual Visits (Telemedicine).  Patients are able to view lab/test results, encounter notes, upcoming appointments, etc.  Non-urgent messages can be sent to your provider as well.   To learn more about what you can do with MyChart, go to [email or web address].    Your next appointment:   As needed  The  format for your next appointment:   In Person  Provider:   You may see Armanda Magic, MD or one of the following Advanced Practice Providers on your designated Care Team:    Ronie Spies, PA-C  Jacolyn Reedy, PA-C    Other Instructions Non-Cardiac CT Angiography (CTA), is a special type of CT scan that uses a computer to produce multi-dimensional views of major blood vessels throughout the body. In CT angiography, a contrast material is injected through an IV to help visualize the blood vessels  DAY of CTA take 100 mg Lopressor Tartrate

## 2021-03-10 ENCOUNTER — Other Ambulatory Visit: Payer: Self-pay

## 2021-03-10 ENCOUNTER — Ambulatory Visit (HOSPITAL_COMMUNITY): Payer: Managed Care, Other (non HMO) | Attending: Cardiology

## 2021-03-10 DIAGNOSIS — R072 Precordial pain: Secondary | ICD-10-CM

## 2021-03-10 LAB — ECHOCARDIOGRAM COMPLETE
Area-P 1/2: 3.31 cm2
S' Lateral: 3.6 cm

## 2021-04-11 ENCOUNTER — Other Ambulatory Visit: Payer: Self-pay | Admitting: *Deleted

## 2021-04-11 DIAGNOSIS — R072 Precordial pain: Secondary | ICD-10-CM

## 2021-05-10 ENCOUNTER — Other Ambulatory Visit: Payer: Self-pay

## 2021-05-10 DIAGNOSIS — R072 Precordial pain: Secondary | ICD-10-CM

## 2021-05-10 NOTE — Progress Notes (Signed)
Ryan Reichert, MD  Theresia Majors, RN Please find out why this study was denied it was a coronary CTA for chest painnot a chest CTA   Orders have been updated

## 2021-05-12 ENCOUNTER — Other Ambulatory Visit: Payer: Self-pay

## 2021-05-12 ENCOUNTER — Other Ambulatory Visit: Payer: Managed Care, Other (non HMO) | Admitting: *Deleted

## 2021-05-12 ENCOUNTER — Inpatient Hospital Stay: Admission: RE | Admit: 2021-05-12 | Payer: Managed Care, Other (non HMO) | Source: Ambulatory Visit

## 2021-05-12 DIAGNOSIS — R072 Precordial pain: Secondary | ICD-10-CM

## 2021-05-13 LAB — BASIC METABOLIC PANEL
BUN/Creatinine Ratio: 14 (ref 10–24)
BUN: 15 mg/dL (ref 8–27)
CO2: 25 mmol/L (ref 20–29)
Calcium: 9.7 mg/dL (ref 8.6–10.2)
Chloride: 101 mmol/L (ref 96–106)
Creatinine, Ser: 1.08 mg/dL (ref 0.76–1.27)
Glucose: 83 mg/dL (ref 65–99)
Potassium: 4.7 mmol/L (ref 3.5–5.2)
Sodium: 141 mmol/L (ref 134–144)
eGFR: 79 mL/min/{1.73_m2} (ref >59)

## 2021-05-19 ENCOUNTER — Encounter (HOSPITAL_COMMUNITY): Payer: Self-pay

## 2021-05-19 ENCOUNTER — Telehealth (HOSPITAL_COMMUNITY): Payer: Self-pay | Admitting: Emergency Medicine

## 2021-05-19 NOTE — Telephone Encounter (Signed)
Reaching out to patient to offer assistance regarding upcoming cardiac imaging study; pt verbalizes understanding of appt date/time, parking situation and where to check in, pre-test NPO status and medications ordered, and verified current allergies; name and call back number provided for further questions should they arise Rockwell Alexandria RN Navigator Cardiac Imaging [facility] [phone] office [phone] cell  Denies claustro  Denies iv issues 100mg  metop succ prior to scan

## 2021-05-19 NOTE — Telephone Encounter (Signed)
Attempted to call patient regarding upcoming cardiac CT appointment. °Left message on voicemail with name and callback number °Song Myre RN Navigator Cardiac Imaging °[facility] °[phone] Office °[phone] Cell ° °

## 2021-05-22 ENCOUNTER — Ambulatory Visit (HOSPITAL_COMMUNITY)
Admission: RE | Admit: 2021-05-22 | Discharge: 2021-05-22 | Disposition: A | Discharge status: Home / Self Care | Payer: Managed Care, Other (non HMO) | Source: Ambulatory Visit | Attending: Cardiology | Admitting: Cardiology

## 2021-05-22 ENCOUNTER — Other Ambulatory Visit: Payer: Self-pay

## 2021-05-22 DIAGNOSIS — R072 Precordial pain: Secondary | ICD-10-CM

## 2021-05-22 MED ORDER — METOPROLOL TARTRATE 5 MG/5ML IV SOLN
INTRAVENOUS | Status: AC
  Administered 2021-05-22: 10 mg via INTRAVENOUS
  Filled 2021-05-22: qty 20

## 2021-05-22 MED ORDER — IOHEXOL 350 MG/ML SOLN
95.0000 mL | Freq: Once | INTRAVENOUS | Status: AC | PRN
  Administered 2021-05-22: 95 mL via INTRAVENOUS

## 2021-05-22 MED ORDER — NITROGLYCERIN 0.4 MG SL SUBL
0.8000 mg | SUBLINGUAL_TABLET | Freq: Once | SUBLINGUAL | Status: AC
  Administered 2021-05-22: 0.8 mg via SUBLINGUAL

## 2021-05-22 MED ORDER — DILTIAZEM HCL 25 MG/5ML IV SOLN
INTRAVENOUS | Status: AC
  Administered 2021-05-22: 10 mg via INTRAVENOUS
  Filled 2021-05-22: qty 5

## 2021-05-22 MED ORDER — METOPROLOL TARTRATE 5 MG/5ML IV SOLN
10.0000 mg | INTRAVENOUS | Status: DC | PRN
  Administered 2021-05-22: 10 mg via INTRAVENOUS

## 2021-05-22 MED ORDER — DILTIAZEM HCL 25 MG/5ML IV SOLN
10.0000 mg | INTRAVENOUS | Status: DC | PRN
  Administered 2021-05-22: 10 mg via INTRAVENOUS

## 2021-05-22 MED ORDER — METOPROLOL TARTRATE 5 MG/5ML IV SOLN
INTRAVENOUS | Status: AC
  Administered 2021-05-22: 10 mg via INTRAVENOUS
  Filled 2021-05-22: qty 10

## 2021-05-22 MED ORDER — NITROGLYCERIN 0.4 MG SL SUBL
SUBLINGUAL_TABLET | SUBLINGUAL | Status: AC
  Filled 2021-05-22: qty 2

## 2021-05-24 ENCOUNTER — Telehealth: Payer: Self-pay | Admitting: Cardiology

## 2021-05-24 DIAGNOSIS — K219 Gastro-esophageal reflux disease without esophagitis: Secondary | ICD-10-CM

## 2021-05-24 DIAGNOSIS — R072 Precordial pain: Secondary | ICD-10-CM

## 2021-05-24 MED ORDER — ASPIRIN EC 81 MG PO TBEC: 81.0000 mg | DELAYED_RELEASE_TABLET | Freq: Every day | ORAL | 3 refills | Status: AC

## 2021-05-24 NOTE — Telephone Encounter (Signed)
Pt is returning call for his results. He received them on MyChart but he does not understand them. Please advise pt further

## 2021-05-24 NOTE — Telephone Encounter (Signed)
Quintella Reichert, MD  05/22/2021  8:29 PM EDT     Coronary CTA showed coronay calcium with mild mixed plaque in the proximal LAD < 50% and soft plaque in the oPDA < 50%.  Please have him come in for FLP and ALT. Start ASA 81mg  daily.  Please find out if he has had any further CP or SOB.  Would try to get off Celebrex.  Please verify if he is still taking Hyzaar which is currently not on his med list   Patient states that he is not taking Hyzaar. He does not recall every taking it.  He states that he is not having chest pain or SOB. He does have some chest discomfort after he eats sometimes.

## 2021-05-31 ENCOUNTER — Other Ambulatory Visit: Payer: Managed Care, Other (non HMO) | Admitting: *Deleted

## 2021-05-31 ENCOUNTER — Other Ambulatory Visit: Payer: Self-pay

## 2021-05-31 DIAGNOSIS — R072 Precordial pain: Secondary | ICD-10-CM

## 2021-05-31 LAB — LIPID PANEL
Chol/HDL Ratio: 7 ratio — ABNORMAL HIGH (ref 0.0–5.0)
Cholesterol, Total: 244 mg/dL — ABNORMAL HIGH (ref 100–199)
HDL: 35 mg/dL — ABNORMAL LOW (ref >39)
LDL Chol Calc (NIH): 161 mg/dL — ABNORMAL HIGH (ref 0–99)
Triglycerides: 256 mg/dL — ABNORMAL HIGH (ref 0–149)
VLDL Cholesterol Cal: 48 mg/dL — ABNORMAL HIGH (ref 5–40)

## 2021-05-31 LAB — ALT: ALT: 21 IU/L (ref 0–44)

## 2021-06-01 ENCOUNTER — Telehealth: Payer: Self-pay

## 2021-06-01 DIAGNOSIS — R072 Precordial pain: Secondary | ICD-10-CM

## 2021-06-01 MED ORDER — ROSUVASTATIN CALCIUM 20 MG PO TABS: 20.0000 mg | ORAL_TABLET | Freq: Every day | ORAL | 3 refills | Status: DC

## 2021-06-01 NOTE — Telephone Encounter (Signed)
The patient has been notified of the result and verbalized understanding.  All questions (if any) were answered. Theresia Majors, RN 06/01/2021 4:13 PM  Rx has been sent in for rosuvastatin. Lab work has been scheduled.

## 2021-06-01 NOTE — Telephone Encounter (Signed)
-----   Message from Quintella Reichert, MD sent at 05/31/2021  9:27 PM EDT ----- Abnormal lipids> LDL and TAGs too high.  Start Crestor 20mg  daily and repeat FLP and ALT In 6 weeks

## 2021-08-01 ENCOUNTER — Other Ambulatory Visit: Payer: Managed Care, Other (non HMO)

## 2021-11-13 ENCOUNTER — Other Ambulatory Visit: Payer: Self-pay | Admitting: Cardiology

## 2022-04-23 IMAGING — CT CT HEART MORP W/ CTA COR W/ SCORE W/ CA W/CM &/OR W/O CM
4 of 7 series · 8 of 20 positions shown, 9 images · IV contrast (APPLIED)
Comparison: CT a of the chest on 10/07/2019
COMPARISON: CT a of the chest on 10/07/2019

Addendum:
EXAM:
OVER-READ INTERPRETATION  CT CHEST

The following report is an over-read performed by radiologist Dr.
Polin Billiot [REDACTED] on 05/22/2021. This
over-read does not include interpretation of cardiac or coronary
anatomy or pathology. The coronary CTA interpretation by the
cardiologist is attached.
CLINICAL DATA: Chest pain
Cardiac CTA
MEDICATIONS:
Sub lingual nitro. 4mg x 2
TECHNIQUE: The patient was scanned on a Siemens [REDACTED]ice scanner. Gantry
rotation speed was 250 msecs. Collimation was 0.6 mm. A 100 kV
prospective scan was triggered in the ascending thoracic aorta at
35-75% of the R-R interval. Average HR during the scan was 60 bpm.
The 3D data set was interpreted on a dedicated work station using
MPR, MIP and VRT modes. A total of 80cc of contrast was used.

[Series 6: best diast 70 % · axial · 0.39mm/px · z∈[+1444,+1483]mm · 2 of 291 slices shown]
[im 97/291  vessel]
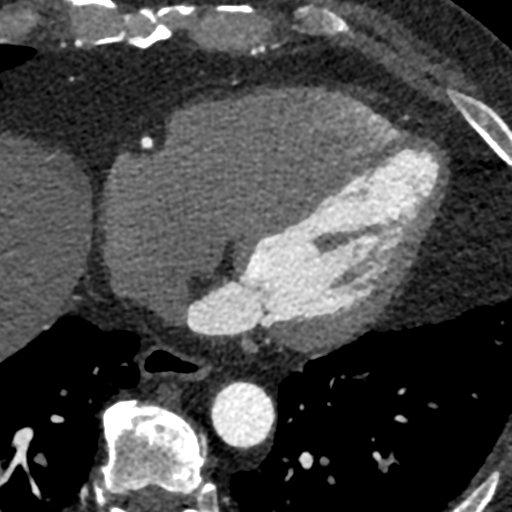
[im 194/291  vessel]
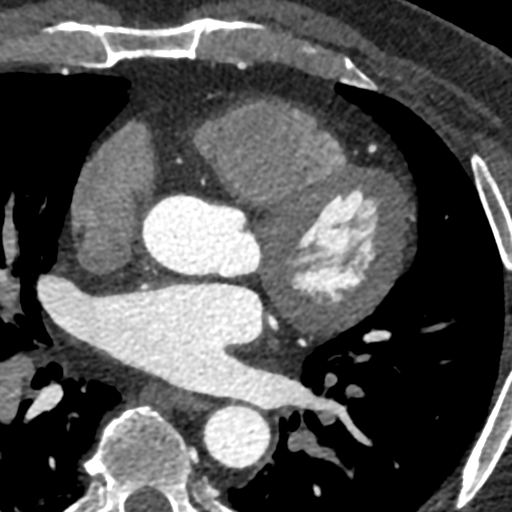

[Series 7: best syst · axial · 0.39mm/px · z∈[+1444,+1483]mm · 2 of 291 slices shown, 3 images]
[im 97/291  vessel]
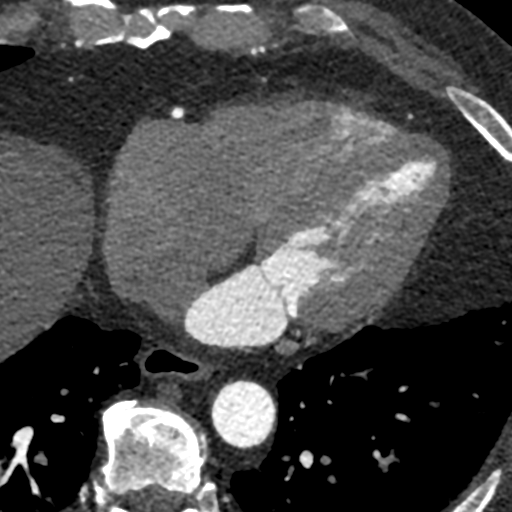
[im 97/291  lung]
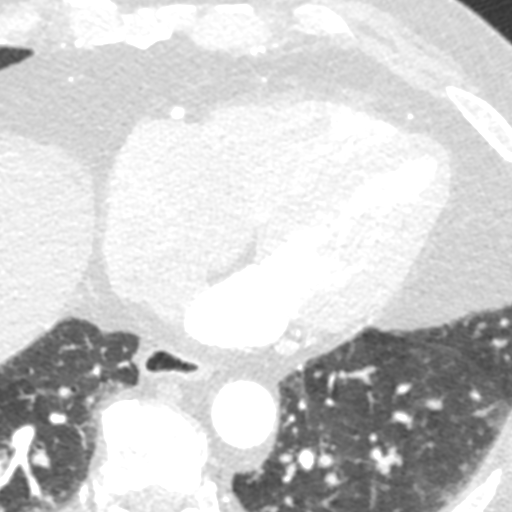
[im 194/291  vessel]
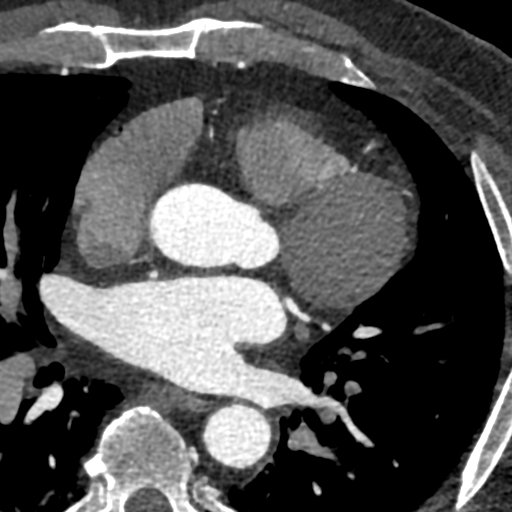

[Series 8: ts diast sharp 70 % · axial · 0.39mm/px · z∈[+1444,+1483]mm · 2 of 291 slices shown]
[im 97/291  lung]
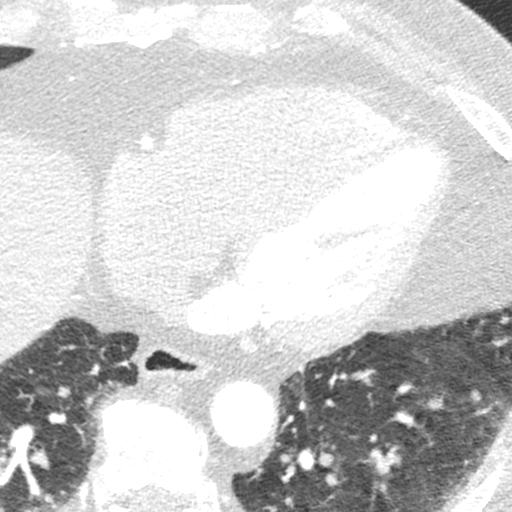
[im 194/291  lung]
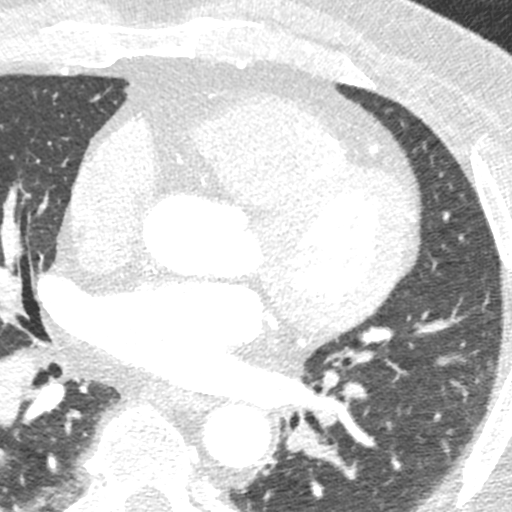

[Series 9: ts syst sharp · axial · 0.39mm/px · z∈[+1444,+1483]mm · 2 of 291 slices shown]
[im 97/291  lung]
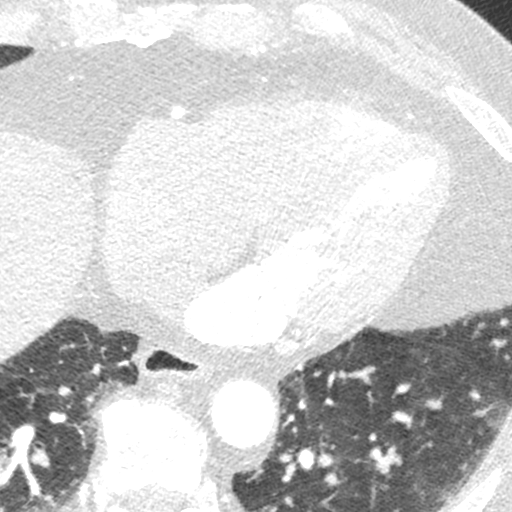
[im 194/291  lung]
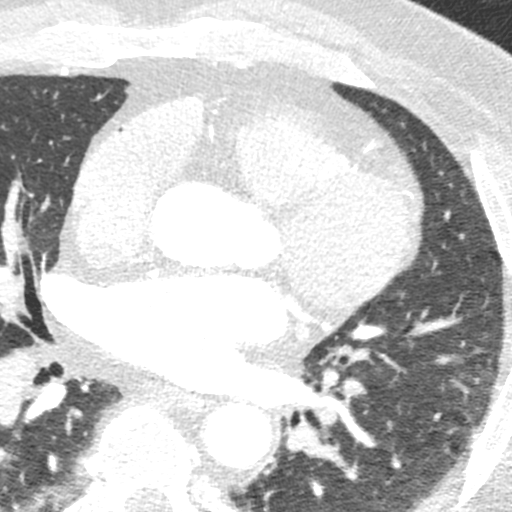

[8 of 20 positions shown; findings below may reference images not displayed]

FINDINGS: Vascular: No significant noncardiac vascular findings.

Mediastinum/Nodes: Visualized mediastinum and hilar regions
demonstrate no lymphadenopathy or masses.

Lungs/Pleura: Visualized lungs show no evidence of pulmonary edema,
consolidation, pneumothorax, nodule or pleural fluid.

Upper Abdomen: No acute abnormality.

Musculoskeletal: No chest wall mass or suspicious bone lesions
identified.
IMPRESSION: No significant incidental findings.
FINDINGS: Non-cardiac: See separate report from [REDACTED].

Pulmonary veins drain normally to the left atrium. No LA appendage
thrombus.

Calcium Score: 6.2 Agatston units.

Coronary Arteries: Right dominant with no anomalies

LM: No plaque or stenosis.

LAD system: Mixed plaque proximal LAD with mild (<50%) stenosis.

Circumflex system: No plaque or stenosis.

RCA system: Soft plaque ostial PDA, mild (<50%) stenosis.
IMPRESSION: 1. Coronary artery calcium score 6.2 Agatston units. This places the
patient in the 34th percentile for age and gender, suggesting low
risk for future cardiac events.

2.  Nonobstructive CAD.

Conchy Fearon

*** End of Addendum ***
EXAM:
OVER-READ INTERPRETATION  CT CHEST

The following report is an over-read performed by radiologist Dr.
Polin Billiot [REDACTED] on 05/22/2021. This
over-read does not include interpretation of cardiac or coronary
anatomy or pathology. The coronary CTA interpretation by the
cardiologist is attached.
FINDINGS: Vascular: No significant noncardiac vascular findings.

Mediastinum/Nodes: Visualized mediastinum and hilar regions
demonstrate no lymphadenopathy or masses.

Lungs/Pleura: Visualized lungs show no evidence of pulmonary edema,
consolidation, pneumothorax, nodule or pleural fluid.

Upper Abdomen: No acute abnormality.

Musculoskeletal: No chest wall mass or suspicious bone lesions
identified.
IMPRESSION: No significant incidental findings.

## 2022-05-15 ENCOUNTER — Other Ambulatory Visit: Payer: Self-pay | Admitting: Cardiology

## 2022-05-17 ENCOUNTER — Other Ambulatory Visit: Payer: Self-pay

## 2022-05-17 ENCOUNTER — Other Ambulatory Visit: Payer: Self-pay | Admitting: Cardiology

## 2022-05-17 DIAGNOSIS — I10 Essential (primary) hypertension: Secondary | ICD-10-CM | POA: Insufficient documentation

## 2022-06-15 ENCOUNTER — Other Ambulatory Visit: Payer: Self-pay | Admitting: Cardiology

## 2022-06-23 ENCOUNTER — Other Ambulatory Visit: Payer: Self-pay | Admitting: Cardiology

## 2023-11-09 NOTE — ED Provider Notes (Signed)
 ------------------------------------------------------------------------------- Attestation signed by Rosamond Lyndy Beagle, MD at 11/09/23 [phone] For this patient visit, I have reviewed the APC's documentation, orders, treatment plan, and medical decision making. I agree with the contents.  -------------------------------------------------------------------------------                                                                                     Emergency Department Provider Note    ED Clinical Impression   Final diagnoses:  Chest pain, unspecified type (Primary)    ED Assessment/Plan    Condition: Stable Disposition: Discharge  This chart has been completed using Dragon Medical Dictation software, and while attempts have been made to ensure accuracy, certain words and phrases may not be transcribed as intended.   History   Chief Complaint  Patient presents with  . Chest Pain   HPI  Ryan Buck is a 63 y.o. male  who presents today to the  emergency department complaining of left sided chest pain x 3 days.   Pt states pain is constant, worse with moving his chest (but not exertional), sharp, at times radiates into his left neck or left arm.   He states he took some tylenol  with some relief.  Denies fever, shob, cough, abd pain, n/v/d.  No distress.      Allergies: has no known allergies. Medications: is not on any long-term medications. PMHx:  has no past medical history on file. PSHx:  has no past surgical history on file. SocHx:   Allergies, Medications, Medical, Surgical, and Social History were reviewed as documented above.   Social Drivers of Health with Concerns   Food Insecurity: Not on file  Internet Connectivity: Not on file  Housing/Utilities: Not on file  Tobacco Use: Medium Risk (03/02/2021)   Received from [facility]   Patient History   . Smoking Tobacco Use: Former   . Smokeless Tobacco Use: Never   . Passive Exposure: Not on file   Transportation Needs: Not on file  Alcohol Use: Not on file  Interpersonal Safety: Not on file  Physical Activity: Not on file  Intimate Partner Violence: Not on file  Stress: Not on file  Substance Use: Not on file (08/07/2023)  Social Connections: Not on file  Financial Resource Strain: Not on file  Depression: Not on file  Health Literacy: Not on file     Review Of Systems  Review of Systems  Constitutional:  Negative for chills and fever.  Respiratory:  Negative for cough and shortness of breath.   Cardiovascular:  Positive for chest pain.  Gastrointestinal:  Negative for abdominal pain, diarrhea, nausea and vomiting.  Genitourinary:  Negative for dysuria and frequency.  Musculoskeletal:  Negative for myalgias and neck pain.  Skin:  Negative for rash and wound.  Neurological:  Negative for dizziness, weakness and headaches.    Physical Exam   BP 136/86   Pulse 94   Temp 36.7 C (98 F) (Temporal)   Resp 18   Ht 170.2 cm (5' 7)   Wt (!) 113.3 kg (249 lb 12.8 oz)   SpO2 97%   BMI 39.12 kg/m   Physical Exam Constitutional:      Appearance:  Normal appearance. He is obese. He is not ill-appearing or diaphoretic.  HENT:     Head: Normocephalic.  Eyes:     Conjunctiva/sclera: Conjunctivae normal.  Cardiovascular:     Rate and Rhythm: Normal rate and regular rhythm.     Pulses: Normal pulses.     Heart sounds: Normal heart sounds.  Pulmonary:     Effort: Pulmonary effort is normal. No tachypnea.     Breath sounds: Normal breath sounds.  Chest:     Chest wall: Tenderness present.  Abdominal:     General: Abdomen is flat. Bowel sounds are normal.     Palpations: Abdomen is soft.     Tenderness: There is no abdominal tenderness.  Musculoskeletal:     Cervical back: Normal range of motion and neck supple.     Right lower leg: No edema.     Left lower leg: No edema.  Skin:    General: Skin is warm.  Neurological:     General: No focal deficit present.      Mental Status: He is alert.  Psychiatric:        Mood and Affect: Mood normal.     ED Course  Medical Decision Making Pt presenting with sharp chest wall pain x 3 days, worse with moving his chest wall and better with rest/tylenol .   HEART score 3;  cardiac evaluation here unremarkable;  pt states he did have a negative stress test several years ago.   Pain is sharp, non-exertional.    No clinical indication for further.        Procedures   Encounter Date: 11/09/23  ECG 12 Lead  Result Value   EKG Systolic BP    EKG Diastolic BP    EKG Ventricular Rate 94   EKG Atrial Rate 94   EKG P-R Interval 180   EKG QRS Duration 106   EKG Q-T Interval 358   EKG QTC Calculation 447   EKG Calculated P Axis 51   EKG Calculated R Axis -26   EKG Calculated T Axis 29   QTC Fredericia 415   Narrative   Normal sinus rhythm Incomplete right bundle branch block Cannot rule out Anterior infarct , age undetermined Abnormal ECG No previous ECGs available     ED Results Results for orders placed or performed during the hospital encounter of 11/09/23  Comprehensive metabolic panel  Result Value Ref Range   Sodium 144 135 - 145 mmol/L   Potassium 3.8 3.5 - 5.0 mmol/L   Chloride 106 98 - 107 mmol/L   CO2 30.6 21.0 - 32.0 mmol/L   Anion Gap 7 3 - 11 mmol/L   BUN 13 8 - 20 mg/dL   Creatinine 8.74 9.19 - 1.30 mg/dL   BUN/Creatinine Ratio 10    eGFR CKD-EPI (2021) Male 65 >=60 mL/min/1.10m2   Glucose 100 70 - 179 mg/dL   Calcium  9.0 8.5 - 10.1 mg/dL   Albumin 3.4 (L) 3.5 - 5.0 g/dL   Total Protein 7.9 6.0 - 8.0 g/dL   Total Bilirubin 0.4 0.3 - 1.2 mg/dL   AST 47 (H) 15 - 40 U/L   ALT 52 12 - 78 U/L   Alkaline Phosphatase 99 46 - 116 U/L  Pro-BNP  Result Value Ref Range   PRO-BNP 87.0 0.0 - 125.0 pg/mL  hsTroponin I (serial 0-2-6H w/ delta)  Result Value Ref Range   hsTroponin I 6 <=53 ng/L  ECG 12 Lead  Result Value Ref Range  EKG Systolic BP  mmHg   EKG Diastolic BP  mmHg   EKG  Ventricular Rate 94 BPM   EKG Atrial Rate 94 BPM   EKG P-R Interval 180 ms   EKG QRS Duration 106 ms   EKG Q-T Interval 358 ms   EKG QTC Calculation 447 ms   EKG Calculated P Axis 51 degrees   EKG Calculated R Axis -26 degrees   EKG Calculated T Axis 29 degrees   QTC Fredericia 415 ms  CBC w/ Differential  Result Value Ref Range   WBC 9.6 4.0 - 10.5 10*9/L   RBC 4.44 4.10 - 5.60 10*12/L   HGB 14.3 12.5 - 17.0 g/dL   HCT 58.9 63.9 - 49.9 %   MCV 92.3 80.0 - 98.0 fL   MCH 32.2 27.0 - 34.0 pg   MCHC 34.9 32.0 - 36.0 g/dL   RDW 86.7 88.4 - 85.4 %   MPV 10.5 (H) 7.4 - 10.4 fL   Platelet 236 140 - 415 10*9/L   Neutrophils % 69.9 %   Lymphocytes % 17.8 %   Monocytes % 8.4 %   Eosinophils % 2.7 %   Basophils % 0.7 %   Absolute Neutrophils 6.7 1.8 - 7.8 10*9/L   Absolute Lymphocytes 1.7 0.7 - 4.5 10*9/L   Absolute Monocytes 0.8 0.1 - 1.0 10*9/L   Absolute Eosinophils 0.3 0.0 - 0.4 10*9/L   Absolute Basophils 0.1 0.0 - 0.2 10*9/L   XR Chest 2 views Result Date: 11/09/2023 CLINICAL DATA:  Chest pain EXAM: CHEST - 2 VIEW COMPARISON:  07/02/2022 FINDINGS: Cardiomediastinal silhouette and pulmonary vasculature are within normal limits. Lungs are clear.   No acute cardiopulmonary process. Electronically Signed   By: Aliene Lloyd M.D.   On: 11/09/2023 11:51   ECG 12 Lead Result Date: 11/09/2023 Normal sinus rhythm Incomplete right bundle branch block Cannot rule out Anterior infarct , age undetermined Abnormal ECG No previous ECGs available   Medications Administered: Medications - No data to display  Discharge Medications (Medications Prescribed during this  ED visit and Patient's Home Medications) :    Your Medication List     START taking these medications    methocarbamol 500 MG tablet Commonly known as: ROBAXIN Take 1 tablet (500 mg total) by mouth two (2) times a day for 10 days.   predniSONE 20 MG tablet Commonly known as: DELTASONE 3 po daily for 2 days, then 2 po daily  for 2 days, then 1 po daily for 2 days          [place], [employer], PA 11/09/23 1326

## 2023-11-09 NOTE — ED Triage Notes (Signed)
 Pt coming from home.  Pt states that for the past five days he has had neck pain that ran up to his head.  Pain has now moved to his chest; slightly to the left side of chest but does radiate down left arm.  Pt also has complaints of SHOB.

## 2024-05-22 ENCOUNTER — Telehealth: Payer: Self-pay | Admitting: *Deleted

## 2024-05-22 NOTE — Telephone Encounter (Signed)
   Pre-operative Risk Assessment    Patient Name: Ryan Buck  DOB: [birth year] MRN: [id number]   Date of last office visit: 03/02/21 DR. TURNER Date of next office visit: NONE- PT WILL NEED A NEW PT APPT   Request for Surgical Clearance    Procedure:  RIGHT SHOULDER SCOPE, SAD, MINI OPEN RCR, BICEP TENODESIS AND DCR  Date of Surgery:  Clearance TBD                                Surgeon:  DR. ELSPETH HER Surgeon's Group or Practice Name:  [facility] Phone number:  [phone] MEGAN DAVIS Fax number:  [phone]   Type of Clearance Requested:   - Medical  - Pharmacy:  Hold Aspirin      Type of Anesthesia:  General    Additional requests/questions:    Bonney Niels Jest   05/22/2024, 10:55 AM

## 2024-05-22 NOTE — Telephone Encounter (Signed)
 Left message to call back to schedule new pt preop appt.

## 2024-05-22 NOTE — Telephone Encounter (Signed)
   Name: Kru Allman  DOB: [birth year]  MRN: [id number]  Primary Cardiologist: Wilbert Bihari, MD  Chart reviewed as part of pre-operative protocol coverage. Because of Jan Olano past medical history and time since last visit, he will require a follow-up in-office visit in order to better assess preoperative cardiovascular risk. Has not been seen since 03/02/2021. Needs to be re-established please.  Pre-op covering staff: - Please schedule appointment and call patient to inform them. If patient already had an upcoming appointment within acceptable timeframe, please add pre-op clearance to the appointment notes so provider is aware. - Please contact requesting surgeon's office via preferred method (i.e, phone, fax) to inform them of need for appointment prior to surgery.   Lamarr Satterfield, NP  05/22/2024, 11:05 AM

## 2024-05-25 NOTE — Telephone Encounter (Signed)
Pt returning call to schedule pre op appt

## 2024-05-25 NOTE — Telephone Encounter (Signed)
 Pt calling again to schedule pre op appt.

## 2024-05-26 NOTE — Telephone Encounter (Signed)
 Message was sent to our scheduling team to schedule patient for an appointment to re-establish care.

## 2024-05-28 NOTE — Telephone Encounter (Signed)
 Pt has new pt appt 06/11/24 Dr. Barbaraann to re-est for preop clearance.

## 2024-06-10 NOTE — Telephone Encounter (Signed)
 Pt has new pt appt with Cardiology [name], MD) 08/07/2024 at 2:40 PM

## 2024-06-11 ENCOUNTER — Ambulatory Visit: Admitting: Cardiovascular Disease

## 2024-08-07 ENCOUNTER — Ambulatory Visit: Admitting: Cardiovascular Disease
